# Patient Record
Sex: Female | Born: 1985
Health system: Southern US, Community
[De-identification: ages and names within clinical notes are randomized; demographics above are authoritative.]

## PROBLEM LIST (undated history)

## (undated) ENCOUNTER — Inpatient Hospital Stay (HOSPITAL_COMMUNITY): Payer: Self-pay

## (undated) DIAGNOSIS — T4145XA Adverse effect of unspecified anesthetic, initial encounter: Secondary | ICD-10-CM

## (undated) DIAGNOSIS — J40 Bronchitis, not specified as acute or chronic: Secondary | ICD-10-CM

## (undated) DIAGNOSIS — J302 Other seasonal allergic rhinitis: Secondary | ICD-10-CM

## (undated) DIAGNOSIS — J45909 Unspecified asthma, uncomplicated: Secondary | ICD-10-CM

## (undated) DIAGNOSIS — T8859XA Other complications of anesthesia, initial encounter: Secondary | ICD-10-CM

## (undated) DIAGNOSIS — B182 Chronic viral hepatitis C: Secondary | ICD-10-CM

## (undated) HISTORY — PX: WISDOM TOOTH EXTRACTION: SHX21

## (undated) HISTORY — DX: Chronic viral hepatitis C: B18.2

---
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
## 2005-06-27 ENCOUNTER — Ambulatory Visit: Payer: Self-pay | Admitting: Internal Medicine

## 2006-01-16 ENCOUNTER — Ambulatory Visit: Payer: Self-pay | Admitting: Internal Medicine

## 2009-02-07 ENCOUNTER — Ambulatory Visit: Payer: Self-pay | Admitting: Internal Medicine

## 2009-02-07 DIAGNOSIS — B1710 Acute hepatitis C without hepatic coma: Secondary | ICD-10-CM | POA: Insufficient documentation

## 2009-02-07 LAB — CONVERTED CEMR LAB
ALT: 8 units/L (ref 0–35)
Alkaline Phosphatase: 66 units/L (ref 39–117)
Basophils Absolute: 0.1 10*3/uL (ref 0.0–0.1)
HCT: 41.3 % (ref 36.0–46.0)
HCV Ab: REACTIVE — AB
INR: 1.06 (ref <1.50)
Indirect Bilirubin: 0.2 mg/dL (ref 0.0–0.9)
Lymphocytes Relative: 20 % (ref 12–46)
Lymphs Abs: 1.6 10*3/uL (ref 0.7–4.0)
Monocytes Absolute: 1.7 10*3/uL — ABNORMAL HIGH (ref 0.1–1.0)
Neutro Abs: 4.5 10*3/uL (ref 1.7–7.7)
Neutrophils Relative %: 55 % (ref 43–77)
Prothrombin Time: 13.7 s (ref 11.6–15.2)
Total Bilirubin: 0.3 mg/dL (ref 0.3–1.2)

## 2009-02-10 ENCOUNTER — Telehealth: Payer: Self-pay | Admitting: Internal Medicine

## 2009-06-28 ENCOUNTER — Ambulatory Visit: Payer: Self-pay | Admitting: Internal Medicine

## 2009-10-10 ENCOUNTER — Encounter: Admission: RE | Admit: 2009-10-10 | Discharge: 2009-10-10 | Payer: Self-pay | Admitting: Family Medicine

## 2010-02-21 NOTE — Assessment & Plan Note (Signed)
Summary: TO EST /HEA   Vital Signs:  Patient profile:   25 year old female Height:      61 inches Weight:      122.25 pounds BMI:     23.18 O2 Sat:      98 % Temp:     98.3 degrees F oral Pulse rate:   86 / minute Pulse rhythm:   regular Resp:     18 per minute BP sitting:   90 / 62  (left arm) Cuff size:   regular  Vitals Entered By: Glendell Docker CMA (February 07, 2009 9:42 AM)  Primary Care Provider:  Dondra Spry DO  CC:  New Patient.  History of Present Illness: New Patient   25 y/o white female to establish.  she reports hx of abnl LFT's age 32.  Her prev checked hepatitis serologies and noted hep c antibody.  she was told she may be carrier.  she does not recall whether viral load checked.  no high risk factors.  she underwent liver biopsy but no subsequent follow up.  no hx of jaundice or scleral icterus.   she works as Museum/gallery exhibitions officer.  no hx of blood exposure or needle stick.   no hx of IVDU no high risk sexual contacts   Preventive Screening-Counseling & Management  Alcohol-Tobacco     Alcohol drinks/day: 1 per month     Alcohol type: all     Smoking Status: never  Caffeine-Diet-Exercise     Caffeine use/day: 2 beverages daily     Does Patient Exercise: yes     Times/week: 3  Allergies (verified): No Known Drug Allergies  Past History:  Past Medical History: ? hx of Hepatitis C  Past Surgical History: Caesarean section  Family History: Family History of Arthritis Family History of Endocrine disorder- Bladder Cancer Family History High cholesterol Family History Hypertension Family History of Stroke F 1st degree relative <60  Social History: Occupation: EMT [employer] Single 1 daughter 4   lives with parents father of her child shares custodySmoking Status:  never Caffeine use/day:  2 beverages daily Does Patient Exercise:  yes  Review of Systems  The patient denies fever, weight loss, weight gain, chest pain, syncope, dyspnea on exertion,  abdominal pain, severe indigestion/heartburn, and depression.    Physical Exam  General:  alert, well-developed, and well-nourished.   Head:  normocephalic and atraumatic.   Eyes:  pupils equal, pupils round, and pupils reactive to light.  no scleral icterus Ears:  R ear normal and L ear normal.   Mouth:  Oral mucosa and oropharynx without lesions or exudates.  Teeth in good repair. Neck:  No deformities, masses, or tenderness noted. Lungs:  Normal respiratory effort, chest expands symmetrically. Lungs are clear to auscultation, no crackles or wheezes. Heart:  Normal rate and regular rhythm. S1 and S2 normal without gallop, murmur, click, rub or other extra sounds. Abdomen:  Bowel sounds positive,abdomen soft and non-tender without masses, organomegaly or hernias noted. Extremities:  No lower extremity edema  Neurologic:  cranial nerves II-XII intact and gait normal.   Psych:  normally interactive, good eye contact, not anxious appearing, and not depressed appearing.     Impression & Recommendations:  Problem # 1:  HEPATITIS C (ICD-070.51) 25 y/o with hx of positive Hep C antibody.  confirm diagnosis.  if viral load - refer to hep c clinic.  she has been vaccinated against hep b.  she thinks she has also been vaccinated against hep  A .  she will check her records.  Avoid alcohol and tylenol.  Orders: T-Hepatitis C Antibody ([id number]) T-Hepatitis C Viral Load [phone]) T-Hepatic Function [phone]) T-Protime, Auto ([id number]) T-CBC w/Diff ([id number]) T-HIV Antibody  (Reflex) [phone])  Complete Medication List: 1)  Trinessa (28) 0.18/0.215/0.25 Mg-35 Mcg Tabs (Norgestim-eth estrad triphasic) .... Take 1 tablet by mouth once a day  Patient Instructions: 1)  Please schedule a follow-up appointment in 2 months.   Preventive Care Screening  Last Tetanus Booster:    Date:  08/10/2008    Results:  Historical   Pap Smear:    Date:  08/10/2008    Results:   normal     Immunization History:  Influenza Immunization History:    Influenza:  historical (01/17/2009)   Current Allergies (reviewed today): No known allergies

## 2010-02-21 NOTE — Progress Notes (Signed)
  Phone Note Outgoing Call   Summary of Call: LVM for pt to call back re:  blood test results Initial call taken by: D. Thomos Lemons DO,  February 10, 2009 5:34 PM  Follow-up for Phone Call        discussed results with pt.   pt likely had falso positive hep c antibody Follow-up by: D. Thomos Lemons DO,  February 11, 2009 4:54 PM

## 2010-07-31 ENCOUNTER — Emergency Department (HOSPITAL_COMMUNITY): Payer: 59

## 2010-07-31 ENCOUNTER — Inpatient Hospital Stay (INDEPENDENT_AMBULATORY_CARE_PROVIDER_SITE_OTHER)
Admission: RE | Admit: 2010-07-31 | Discharge: 2010-07-31 | Disposition: A | Discharge status: Home / Self Care | Payer: 59 | Source: Ambulatory Visit | Attending: Emergency Medicine | Admitting: Emergency Medicine

## 2010-07-31 ENCOUNTER — Emergency Department (HOSPITAL_COMMUNITY)
Admission: EM | Admit: 2010-07-31 | Discharge: 2010-07-31 | Disposition: A | Discharge status: Home / Self Care | Payer: 59 | Attending: Emergency Medicine | Admitting: Emergency Medicine

## 2010-07-31 DIAGNOSIS — R079 Chest pain, unspecified: Secondary | ICD-10-CM | POA: Insufficient documentation

## 2010-07-31 DIAGNOSIS — Z8249 Family history of ischemic heart disease and other diseases of the circulatory system: Secondary | ICD-10-CM | POA: Insufficient documentation

## 2010-07-31 LAB — POCT URINALYSIS DIP (DEVICE)
Ketones, ur: NEGATIVE mg/dL
Leukocytes, UA: NEGATIVE
Nitrite: NEGATIVE
Specific Gravity, Urine: 1.015 (ref 1.005–1.030)
Urobilinogen, UA: 1 mg/dL (ref 0.0–1.0)

## 2010-08-04 ENCOUNTER — Telehealth: Payer: Self-pay | Admitting: *Deleted

## 2010-08-04 NOTE — Telephone Encounter (Signed)
VM left by pt stating that she had been seen in the ED for chest pains.  Eval in ED was ruled inconclusive.  Pt states she was told by attending physician that she would need a referral to cardiology for stress test.  Pt would like referral to cardiology.  Pt has not been seen by Dr. Artist Pais since 01/2009.  RC to pt.  I have attempted to contact this patient by phone with the following results: left message to return my call on answering machine (home).

## 2010-08-16 NOTE — Telephone Encounter (Signed)
Pt initiated call on 08/03/10.  RC to pt has not been returned.  Follow up ended.

## 2010-12-08 ENCOUNTER — Encounter: Payer: Self-pay | Admitting: Family

## 2010-12-08 ENCOUNTER — Telehealth: Payer: Self-pay | Admitting: Family

## 2010-12-08 ENCOUNTER — Ambulatory Visit (HOSPITAL_BASED_OUTPATIENT_CLINIC_OR_DEPARTMENT_OTHER)
Admission: RE | Admit: 2010-12-08 | Discharge: 2010-12-08 | Disposition: A | Discharge status: Home / Self Care | Payer: 59 | Source: Ambulatory Visit | Attending: Family | Admitting: Family

## 2010-12-08 ENCOUNTER — Ambulatory Visit (INDEPENDENT_AMBULATORY_CARE_PROVIDER_SITE_OTHER): Payer: 59 | Admitting: Family

## 2010-12-08 VITALS — BP 96/70 | HR 78 | Temp 97.8°F | Resp 16 | Wt 124.0 lb

## 2010-12-08 DIAGNOSIS — R220 Localized swelling, mass and lump, head: Secondary | ICD-10-CM

## 2010-12-08 DIAGNOSIS — L0201 Cutaneous abscess of face: Secondary | ICD-10-CM

## 2010-12-08 DIAGNOSIS — L03211 Cellulitis of face: Secondary | ICD-10-CM | POA: Insufficient documentation

## 2010-12-08 DIAGNOSIS — B1710 Acute hepatitis C without hepatic coma: Secondary | ICD-10-CM

## 2010-12-08 DIAGNOSIS — B1920 Unspecified viral hepatitis C without hepatic coma: Secondary | ICD-10-CM

## 2010-12-08 DIAGNOSIS — Z0189 Encounter for other specified special examinations: Secondary | ICD-10-CM

## 2010-12-08 DIAGNOSIS — R221 Localized swelling, mass and lump, neck: Secondary | ICD-10-CM

## 2010-12-08 LAB — HEPATIC FUNCTION PANEL
ALT: 9 U/L (ref 0–35)
AST: 14 U/L (ref 0–37)
Bilirubin, Direct: 0.1 mg/dL (ref 0.0–0.3)
Indirect Bilirubin: 0.3 mg/dL (ref 0.0–0.9)

## 2010-12-08 MED ORDER — IOHEXOL 300 MG/ML  SOLN
75.0000 mL | Freq: Once | INTRAMUSCULAR | Status: AC | PRN
  Administered 2010-12-08: 75 mL via INTRAVENOUS

## 2010-12-08 MED ORDER — DOXYCYCLINE HYCLATE 100 MG PO CAPS: 100.0000 mg | ORAL_CAPSULE | Freq: Two times a day (BID) | ORAL | Status: DC

## 2010-12-08 NOTE — Assessment & Plan Note (Signed)
Last visit with Dr. Artist Pais >1 yr ago she had an undetectable viral load and normal LFT's.  Will repeat today.

## 2010-12-08 NOTE — Assessment & Plan Note (Signed)
25 yr old female with mild left sided facial swelling and tenderness.  CT is neg for abscess, though does not some mild LAD on the left neck- likely reactive.  Will plan to treat with doxy for probable early cellulitis.  She added claritin which is reasonable to continue for now. She is instructed to call if increased pain, swelling redness. Go to ER immediately if visual problems.  She verbalizes understanding.

## 2010-12-08 NOTE — Telephone Encounter (Signed)
Left message for pt to return our call.  When she calls please let her know that her CT did not show any abscess.  Noted mild enlargement of lymph nodes on her left which I would expect.  She should plan to take abx as we discussed and follow up early next week.

## 2010-12-08 NOTE — Telephone Encounter (Signed)
Pt returned my call. Reviewed results and plans as outlined below.

## 2010-12-08 NOTE — Patient Instructions (Addendum)
Please complete your lab work prior to leaving today. Complete your CT on the first floor.  Follow up with Dr. Rodena Medin on Monday or Tuesday of next week.   Call if you develop increased pain, swelling redness. Go to the ER if you develop problems with vision.

## 2010-12-08 NOTE — Progress Notes (Signed)
  Subjective:    Patient ID: Wendy Nunez, female    DOB: 09/21/1985, 24 y.o.   MRN: [id number]  HPI  Ms.  Wendy Nunez is a 25 yr old female who presents today with chief complaint of left sided facial swelling. She reports that yesterday she had significant swelling of her left eyelid and was unable to apply mascara.  "I couldn't find my eyelashes."  Symptoms started 48 hours ago and are unchanged despite the use of benadryl or claritin. Swelling is associated with tenderness and tingling but not associated with fever or pruritis.   She denies previous history of similar symptoms.    Hepatitis C-  She believes that she contracted hepatitis C when she was a senior in high school and she completed a medical mission trip to [place].  It was shortly after her trip that she was noted to elevated LFT's.  She denies any other risk factors for hepatitis C. 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    Review of Systems See HPI  Past Medical History  Diagnosis Date  . 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 Marital Status: Single    Spouse Name: N/A    Number of Children: N/A  . Years of Education: N/A   Occupational History  . Not on file.   Social History Main Topics  . Smoking status: Never Smoker   . Smokeless tobacco: Never Used  . Alcohol Use: Not on file  . Drug Use: Not on file  . Sexually Active: Not on file   Other Topics Concern  . 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    Past Surgical History  Procedure Date  . Cesarean section     Family History  Problem Relation Age of Onset  .       No Known Allergies  No current outpatient prescriptions on file prior to visit.   No current facility-administered medications on file prior to visit.    BP 96/70  Pulse 78  Temp(Src) 97.8 F (36.6 C) (Oral)  Resp 16  Wt 124 lb 0.6 oz (56.264 kg)  LMP  12/05/2010         Objective:   Physical Exam  Constitutional: She appears well-developed and well-nourished. No distress.  HENT:  Head: Normocephalic and atraumatic.  Right Ear: Tympanic membrane and ear canal normal.  Left Ear: Tympanic membrane and ear canal normal.  Mouth/Throat: Uvula is midline, oropharynx is clear and moist and mucous membranes are normal.       + mild swelling of left cheek with tenderness to palpation.  No significant erythema is noted.   Neck:       + tenderness left anterior neck without significant LAD swelling or erythema.   Cardiovascular: Normal rate and regular rhythm.   No murmur heard. Pulmonary/Chest: Effort normal and breath sounds normal. No respiratory distress. She has no wheezes. She has no rales.          Assessment & Plan:

## 2010-12-11 ENCOUNTER — Telehealth: Payer: Self-pay | Admitting: *Deleted

## 2010-12-11 MED ORDER — AMOXICILLIN-POT CLAVULANATE 875-125 MG PO TABS: 1.0000 | ORAL_TABLET | Freq: Two times a day (BID) | ORAL | Status: AC

## 2010-12-11 NOTE — Telephone Encounter (Signed)
Received call from pt stating she is taking doxycycline exactly as recommended but she has had n/v all weekend and wants to know if there is another antibiotic she can try? Please advise.

## 2010-12-11 NOTE — Telephone Encounter (Signed)
augmentin 875mg  po bid x 6d. May cause loose stools

## 2010-12-11 NOTE — Telephone Encounter (Signed)
Pt notified and rx sent to [facility] pharmacy.

## 2010-12-12 ENCOUNTER — Ambulatory Visit (INDEPENDENT_AMBULATORY_CARE_PROVIDER_SITE_OTHER): Payer: 59 | Admitting: Internal Medicine

## 2010-12-12 ENCOUNTER — Encounter: Payer: Self-pay | Admitting: Internal Medicine

## 2010-12-12 DIAGNOSIS — L03211 Cellulitis of face: Secondary | ICD-10-CM

## 2010-12-12 DIAGNOSIS — R894 Abnormal immunological findings in specimens from other organs, systems and tissues: Secondary | ICD-10-CM

## 2010-12-12 DIAGNOSIS — R768 Other specified abnormal immunological findings in serum: Secondary | ICD-10-CM

## 2010-12-12 LAB — HEPATITIS C RNA QUANTITATIVE

## 2010-12-12 NOTE — Progress Notes (Signed)
  Subjective:    Patient ID: Wendy Nunez, female    DOB: 1985-09-02, 25 y.o.   MRN: [id number]  HPI Pt presents to clinic for followup of multiple medical problems. Recently evaluated for left periorbital cellulitis. CT neg. Initially placed on doxycycline but could not tolerate. Yesterday changed to augmentin. Notes significant improvement of swelling. Has intact EOM and no visual changes. No f/c. Has questions about hep c status. Recalls several years ago had mildly elevated lft's. As part of the workup underwent hepatitis testing and was told had hep c ab +.  Reviewed past labs including normalized lft's and a undetectable hep c RNA quant. Has repeat quant pending from last visit. No known potential hep c exposures. No s/s of liver dz.  Reviewed pmh, medications, and allergies  Review of Systems see hpi     Objective:   Physical Exam  Nursing note and vitals reviewed. Constitutional: She appears well-developed and well-nourished. No distress.  HENT:  Head: Normocephalic and atraumatic.  Right Ear: External ear normal.  Left Ear: External ear normal.  Eyes: Conjunctivae and EOM are normal. Right eye exhibits no discharge. Left eye exhibits no discharge. No scleral icterus.  Skin: Skin is warm and dry. She is not diaphoretic.       No appreciable ST swelling of eyelid or periorbital area.          Assessment & Plan:

## 2010-12-16 DIAGNOSIS — R768 Other specified abnormal immunological findings in serum: Secondary | ICD-10-CM | POA: Insufficient documentation

## 2010-12-16 NOTE — Assessment & Plan Note (Signed)
Significant improvement. Continue abx to completion.

## 2010-12-16 NOTE — Assessment & Plan Note (Signed)
In the setting of very low risk person, nl lft's and neg rna quant suspect ab is false positive. Repeat quant pending currently.

## 2010-12-18 ENCOUNTER — Encounter: Payer: Self-pay | Admitting: Family

## 2011-01-04 ENCOUNTER — Encounter: Payer: Self-pay | Admitting: Family

## 2011-01-04 ENCOUNTER — Encounter: Payer: Self-pay | Admitting: *Deleted

## 2011-01-04 ENCOUNTER — Ambulatory Visit: Payer: 59 | Admitting: Internal Medicine

## 2011-01-05 ENCOUNTER — Ambulatory Visit (INDEPENDENT_AMBULATORY_CARE_PROVIDER_SITE_OTHER): Payer: 59 | Admitting: Internal Medicine

## 2011-01-05 ENCOUNTER — Ambulatory Visit (HOSPITAL_BASED_OUTPATIENT_CLINIC_OR_DEPARTMENT_OTHER)
Admission: RE | Admit: 2011-01-05 | Discharge: 2011-01-05 | Disposition: A | Discharge status: Home / Self Care | Payer: 59 | Source: Ambulatory Visit | Attending: Internal Medicine | Admitting: Internal Medicine

## 2011-01-05 ENCOUNTER — Encounter: Payer: Self-pay | Admitting: Internal Medicine

## 2011-01-05 VITALS — BP 100/70 | HR 66 | Temp 98.2°F | Resp 16 | Wt 142.0 lb

## 2011-01-05 DIAGNOSIS — M25579 Pain in unspecified ankle and joints of unspecified foot: Secondary | ICD-10-CM | POA: Insufficient documentation

## 2011-01-05 DIAGNOSIS — R609 Edema, unspecified: Secondary | ICD-10-CM | POA: Insufficient documentation

## 2011-01-05 DIAGNOSIS — M25572 Pain in left ankle and joints of left foot: Secondary | ICD-10-CM

## 2011-01-05 DIAGNOSIS — W19XXXA Unspecified fall, initial encounter: Secondary | ICD-10-CM

## 2011-01-05 DIAGNOSIS — M7989 Other specified soft tissue disorders: Secondary | ICD-10-CM

## 2011-01-05 MED ORDER — DICLOFENAC SODIUM 50 MG PO TBEC: DELAYED_RELEASE_TABLET | ORAL | Status: AC

## 2011-01-05 NOTE — Progress Notes (Signed)
  Subjective:    Patient ID: Wendy Nunez, female    DOB: 1985-04-16, 25 y.o.   MRN: [id number]  HPI Pt presents to clinic for evaluation of ankle pain. Notes ~1 wk h/o left ankle injury s/p fall. Unsure of exact injury mechanism but may have been eversion. Pain primarily near lateral malleolar area. Able to wt bear but since injury has noted difficulty with instability and secondary falls. attemped RICE measures. No other alleviating or exacerbating factors. No other complaints.  Past Medical History  Diagnosis Date  . Hepatitis C carrier    Past Surgical History  Procedure Date  . Cesarean section     reports that she has never smoked. She has never used smokeless tobacco. Her alcohol and drug histories not on file. family history includes Arthritis in her other; Cancer in her other; Hyperlipidemia in her other; Hypertension in her other; and Stroke in her other. No Known Allergies    Review of Systems see hpi     Objective:   Physical Exam  Nursing note and vitals reviewed. Constitutional: She appears well-developed and well-nourished. No distress.  Musculoskeletal:       Left ankle/foot with mild ST swelling. Distal fibula/lat malleolar area with point tenderness without bony abn. Pain reproduced with ankle eversion. Able to wt bear and ambulate without assistance.  Neurological: She is alert.  Skin: Skin is warm and dry. She is not diaphoretic.  Psychiatric: She has a normal mood and affect.          Assessment & Plan:

## 2011-01-06 DIAGNOSIS — M25572 Pain in left ankle and joints of left foot: Secondary | ICD-10-CM | POA: Insufficient documentation

## 2011-01-06 NOTE — Assessment & Plan Note (Signed)
Obtain plain radiograph of left ankle. Attempt voltaren with food and no other nsaid. Consider referral if xray unremarkable and sx's persistent given subjective instability

## 2011-01-08 ENCOUNTER — Ambulatory Visit: Payer: 59 | Admitting: Family Medicine

## 2011-01-29 ENCOUNTER — Ambulatory Visit: Payer: Self-pay

## 2011-01-29 ENCOUNTER — Other Ambulatory Visit: Payer: Self-pay | Admitting: Occupational Medicine

## 2011-01-29 DIAGNOSIS — R52 Pain, unspecified: Secondary | ICD-10-CM

## 2011-01-30 ENCOUNTER — Other Ambulatory Visit: Payer: Self-pay | Admitting: Occupational Medicine

## 2011-01-30 DIAGNOSIS — M25561 Pain in right knee: Secondary | ICD-10-CM

## 2011-01-30 DIAGNOSIS — T148XXA Other injury of unspecified body region, initial encounter: Secondary | ICD-10-CM

## 2011-02-02 ENCOUNTER — Ambulatory Visit (HOSPITAL_COMMUNITY)
Admission: RE | Admit: 2011-02-02 | Discharge: 2011-02-02 | Disposition: A | Discharge status: Home / Self Care | Payer: PRIVATE HEALTH INSURANCE | Source: Ambulatory Visit | Attending: Occupational Medicine | Admitting: Occupational Medicine

## 2011-02-02 DIAGNOSIS — M25561 Pain in right knee: Secondary | ICD-10-CM

## 2011-02-02 DIAGNOSIS — X500XXA Overexertion from strenuous movement or load, initial encounter: Secondary | ICD-10-CM | POA: Insufficient documentation

## 2011-02-02 DIAGNOSIS — M25669 Stiffness of unspecified knee, not elsewhere classified: Secondary | ICD-10-CM | POA: Insufficient documentation

## 2011-02-02 DIAGNOSIS — T148XXA Other injury of unspecified body region, initial encounter: Secondary | ICD-10-CM

## 2011-02-02 DIAGNOSIS — M25569 Pain in unspecified knee: Secondary | ICD-10-CM | POA: Insufficient documentation

## 2011-02-02 DIAGNOSIS — M25469 Effusion, unspecified knee: Secondary | ICD-10-CM | POA: Insufficient documentation

## 2011-02-02 DIAGNOSIS — S8990XA Unspecified injury of unspecified lower leg, initial encounter: Secondary | ICD-10-CM | POA: Insufficient documentation

## 2011-04-23 ENCOUNTER — Encounter: Payer: Self-pay | Admitting: Family

## 2011-04-23 ENCOUNTER — Ambulatory Visit (INDEPENDENT_AMBULATORY_CARE_PROVIDER_SITE_OTHER): Payer: Self-pay | Admitting: Family

## 2011-04-23 VITALS — BP 100/68 | HR 73 | Temp 98.8°F | Resp 16 | Wt 145.1 lb

## 2011-04-23 DIAGNOSIS — F438 Other reactions to severe stress: Secondary | ICD-10-CM

## 2011-04-23 DIAGNOSIS — F430 Acute stress reaction: Secondary | ICD-10-CM | POA: Insufficient documentation

## 2011-04-23 DIAGNOSIS — F4389 Other reactions to severe stress: Secondary | ICD-10-CM

## 2011-04-23 NOTE — Patient Instructions (Signed)
You will be contacted about your referral to behavioral health.

## 2011-04-23 NOTE — Assessment & Plan Note (Signed)
I do not think that she needs medication at this point as her reaction to her situation is very normal.  We discussed that referral to a therapist may be helpful for her and she wishes to proceed with this referral.

## 2011-04-23 NOTE — Progress Notes (Signed)
  Subjective:    Patient ID: Wendy Nunez, female    DOB: [birth year], 26 y.o.   MRN: [id number]  HPI  Wendy Nunez is a 26 yr old female who presents today to discuss stress.  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  She reports that she notes some occasional anxiety about her situation, but denies hopelessness.  She reports that she is sleeping well.    Review of Systems See HPI    Objective:   Physical Exam  Constitutional: She appears well-developed and well-nourished. No distress.  Psychiatric: Her speech is normal and behavior is normal. Judgment and thought content normal. Her mood appears not anxious. Her affect is not angry, not blunt, not labile and not inappropriate. Cognition and memory are normal.       Mildly tearful but appropriate affect.           Assessment & Plan:

## 2012-02-01 ENCOUNTER — Ambulatory Visit (INDEPENDENT_AMBULATORY_CARE_PROVIDER_SITE_OTHER): Payer: 59 | Admitting: Family

## 2012-02-01 ENCOUNTER — Encounter: Payer: Self-pay | Admitting: Family

## 2012-02-01 ENCOUNTER — Ambulatory Visit (HOSPITAL_BASED_OUTPATIENT_CLINIC_OR_DEPARTMENT_OTHER)
Admission: RE | Admit: 2012-02-01 | Discharge: 2012-02-01 | Disposition: A | Discharge status: Home / Self Care | Payer: 59 | Source: Ambulatory Visit | Attending: Family | Admitting: Family

## 2012-02-01 VITALS — BP 114/72 | HR 74 | Temp 97.8°F | Resp 16

## 2012-02-01 DIAGNOSIS — G5600 Carpal tunnel syndrome, unspecified upper limb: Secondary | ICD-10-CM

## 2012-02-01 DIAGNOSIS — M542 Cervicalgia: Secondary | ICD-10-CM | POA: Insufficient documentation

## 2012-02-01 DIAGNOSIS — R768 Other specified abnormal immunological findings in serum: Secondary | ICD-10-CM

## 2012-02-01 DIAGNOSIS — M4040 Postural lordosis, site unspecified: Secondary | ICD-10-CM | POA: Insufficient documentation

## 2012-02-01 DIAGNOSIS — G5602 Carpal tunnel syndrome, left upper limb: Secondary | ICD-10-CM | POA: Insufficient documentation

## 2012-02-01 DIAGNOSIS — R894 Abnormal immunological findings in specimens from other organs, systems and tissues: Secondary | ICD-10-CM

## 2012-02-01 MED ORDER — CYCLOBENZAPRINE HCL 5 MG PO TABS: 5.0000 mg | ORAL_TABLET | Freq: Three times a day (TID) | ORAL | Status: DC | PRN

## 2012-02-01 MED ORDER — MELOXICAM 7.5 MG PO TABS: 7.5000 mg | ORAL_TABLET | Freq: Every day | ORAL | Status: DC

## 2012-02-01 NOTE — Progress Notes (Signed)
  Subjective:    Patient ID: Wendy Nunez, female    DOB: Apr 02, 1985, 27 y.o.   MRN: [id number]  HPI  Wendy Nunez is a 27 yr old female who presents today with chief complaint of headache. She reports that she has had daily headaches for 2 months.  She also notes left upper back pain/shoulder pain.  Pain is sharp and stabbing pain in the left neck area.  Occasional numbness in the left middle 3 fingers.  She tried decreasing her caffeine, ibuprofen and excedrin migraine without improvement. Denies associated photophobia or nausea.  Sometimes left hand feels week.      Review of Systems See HPI  Past Medical History  Diagnosis Date  . 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 Marital Status: Single    Spouse Name: N/A    Number of Children: N/A  . Years of Education: N/A   Occupational History  . Not on file.   Social History Main Topics  . Smoking status: Never Smoker   . Smokeless tobacco: Never Used  . Alcohol Use: Not on file  . Drug Use: Not on file  . Sexually Active: Not on file   Other Topics Concern  . 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    Past Surgical History  Procedure Date  . Cesarean section     Family History  Problem Relation Age of Onset  . Arthritis Other   . Cancer Other     bladder  . Hyperlipidemia Other   . Hypertension Other   . Stroke Other     No Known Allergies  No current outpatient prescriptions on file prior to visit.    BP 114/72  Pulse 74  Temp 97.8 F (36.6 C) (Oral)  Resp 16  SpO2 99%  LMP 01/25/2012       Objective:   Physical Exam  Constitutional: She is oriented to person, place, and time. She appears well-developed and well-nourished. No distress.  HENT:  Head: Normocephalic and atraumatic.  Cardiovascular: Normal rate and regular rhythm.   No murmur heard. Pulmonary/Chest: Effort normal and breath sounds  normal. No respiratory distress. She has no wheezes. She has no rales. She exhibits no tenderness.  Musculoskeletal: She exhibits no edema.       + tenderness at left base of neck.   Neurological: She is alert and oriented to person, place, and time.       Bilateral hand grasps 5/5 + phalans, + tinels sign left.   Skin: Skin is warm and dry.  Psychiatric: She has a normal mood and affect. Her behavior is normal. Judgment and thought content normal.          Assessment & Plan:

## 2012-02-01 NOTE — Assessment & Plan Note (Signed)
Recommended trial of cock up wrist splint and nsaids.

## 2012-02-01 NOTE — Assessment & Plan Note (Signed)
Repeat viral load last visit was undetectable- likely was false +.

## 2012-02-01 NOTE — Patient Instructions (Addendum)
Please complete your neck x ray on the first floor.  Wear wrist splint left wrist while sleeping and as tolerated during the day. Call if symptoms worsen or if not improved in 1 month.

## 2012-02-01 NOTE — Assessment & Plan Note (Signed)
Trial of meloxicam and prn flexeril for short term use.  Obtain x-ray of the C spine for further evaluation.

## 2012-03-12 ENCOUNTER — Ambulatory Visit (INDEPENDENT_AMBULATORY_CARE_PROVIDER_SITE_OTHER): Payer: 59 | Admitting: Family

## 2012-03-12 ENCOUNTER — Encounter: Payer: Self-pay | Admitting: Family

## 2012-03-12 VITALS — BP 100/70 | HR 71 | Temp 98.1°F | Resp 16 | Wt 150.0 lb

## 2012-03-12 DIAGNOSIS — M542 Cervicalgia: Secondary | ICD-10-CM

## 2012-03-12 NOTE — Progress Notes (Signed)
  Subjective:    Patient ID: Wendy Nunez, female    DOB: December 22, 1985, 27 y.o.   MRN: [id number]  HPI  Wendy Nunez is a 27 yr old female who presents today for follow up of her neck pain. She was initially evaluated for this on 1/10 and underwent x ray of the C spine.  X ray was unremarkable except for note of straightening of the normal cervical lordosis which was though possibly secondary to muscle spasm.  She was rx'd meloxicam and flexeril.  No significant improvement with these meds.  Flexeril made  Her sleep.  She reports that the discomfort is "achey" in the neck.  Discomfort into the bilateral upper shoulders.  She has associated HA.      Review of Systems    see HPI  Past Medical History  Diagnosis Date  . 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 Marital Status: Single    Spouse Name: N/A    Number of Children: N/A  . Years of Education: N/A   Occupational History  . Not on file.   Social History Main Topics  . Smoking status: Never Smoker   . Smokeless tobacco: Never Used  . Alcohol Use: Not on file  . Drug Use: Not on file  . Sexually Active: Not on file   Other Topics Concern  . 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    Past Surgical History  Procedure Laterality Date  . Cesarean section      Family History  Problem Relation Age of Onset  . Arthritis Other   . Cancer Other     bladder  . Hyperlipidemia Other   . Hypertension Other   . Stroke Other     No Known Allergies  Current Outpatient Prescriptions on File Prior to Visit  Medication Sig Dispense Refill  . cyclobenzaprine (FLEXERIL) 5 MG tablet Take 1 tablet (5 mg total) by mouth 3 (three) times daily as needed for muscle spasms.  15 tablet  0  . meloxicam (MOBIC) 7.5 MG tablet Take 1 tablet (7.5 mg total) by mouth daily.  15 tablet  0   No current facility-administered medications on file  prior to visit.    BP 100/70  Pulse 71  Temp(Src) 98.1 F (36.7 C) (Oral)  Resp 16  Wt 150 lb (68.04 kg)  BMI 28.36 kg/m2  SpO2 99%  LMP 02/22/2012    Objective:   Physical Exam  Constitutional: She appears well-developed and well-nourished. No distress.  Cardiovascular: Normal rate and regular rhythm.   No murmur heard. Pulmonary/Chest: Effort normal and breath sounds normal. No respiratory distress. She has no wheezes. She has no rales. She exhibits no tenderness.  Musculoskeletal: She exhibits no edema.  Full ROM of neck.     Neurological:  Bilateral hand grasps/ UE/LE strength is 5/5.          Assessment & Plan:

## 2012-03-12 NOTE — Assessment & Plan Note (Signed)
Will refer for trial of PT.  If symptoms worsen, or if no improvement with PT, consider MRI C spine.

## 2012-03-12 NOTE — Patient Instructions (Addendum)
You will be contacted about your referral to PT.  Please let us know if you have not heard back within 1 week about your referral. Please follow up in 6 weeks.

## 2012-03-21 ENCOUNTER — Ambulatory Visit: Payer: 59 | Attending: Family | Admitting: Rehabilitation

## 2012-03-21 DIAGNOSIS — IMO0001 Reserved for inherently not codable concepts without codable children: Secondary | ICD-10-CM | POA: Insufficient documentation

## 2012-03-21 DIAGNOSIS — M2569 Stiffness of other specified joint, not elsewhere classified: Secondary | ICD-10-CM | POA: Insufficient documentation

## 2012-03-21 DIAGNOSIS — M542 Cervicalgia: Secondary | ICD-10-CM | POA: Insufficient documentation

## 2012-03-23 IMAGING — CR DG ANKLE COMPLETE 3+V*L*
3 series · 3 of 3 positions shown · non-contrast
Comparison: None.

CLINICAL DATA: Ankle pain and swelling status post fall.

LEFT ANKLE COMPLETE - 3+ VIEW

[t ankle joint ap left]
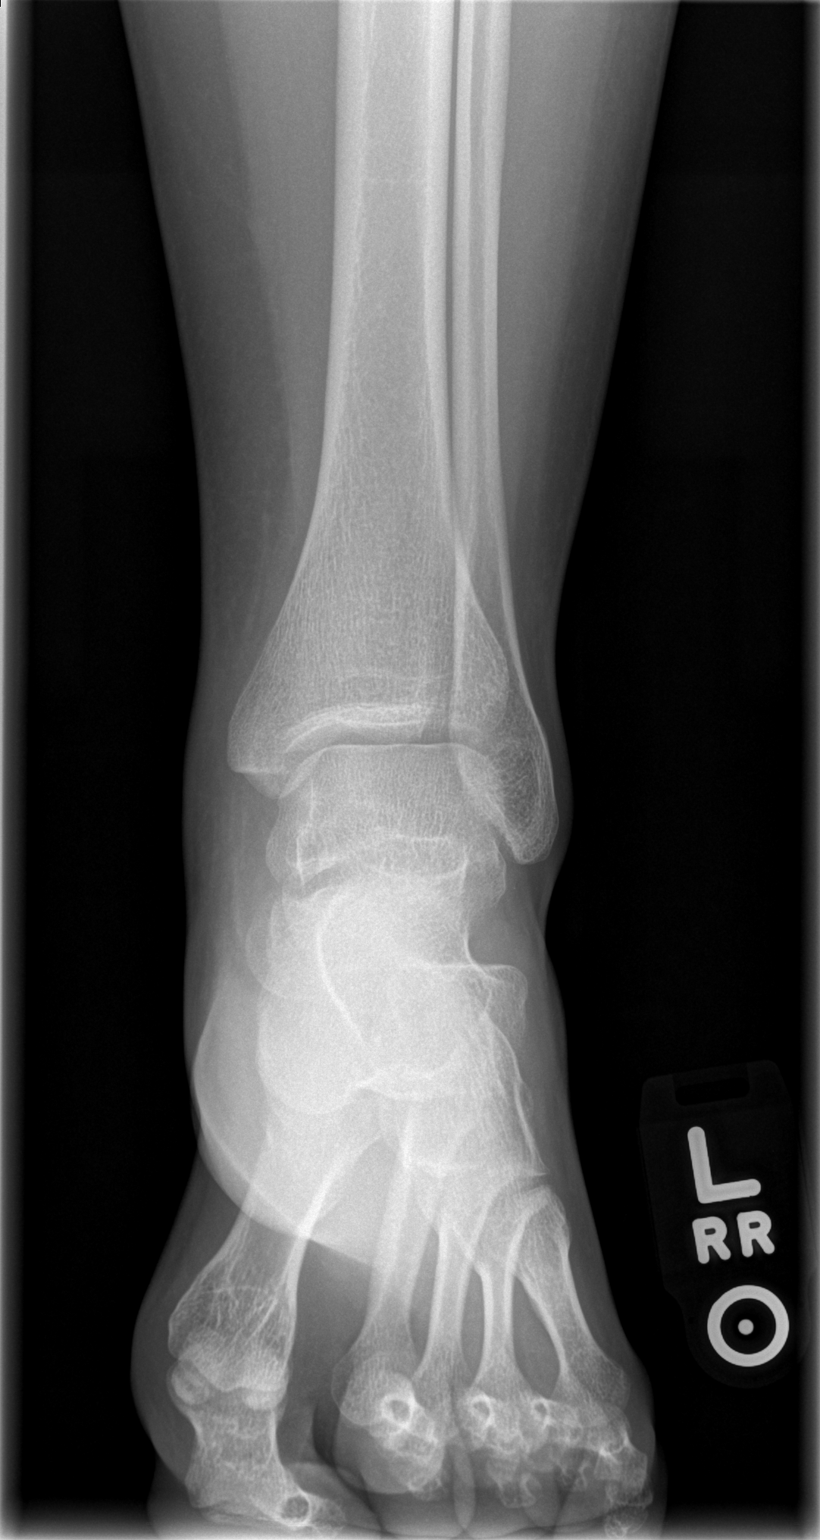

[t ankle joint oblique left]
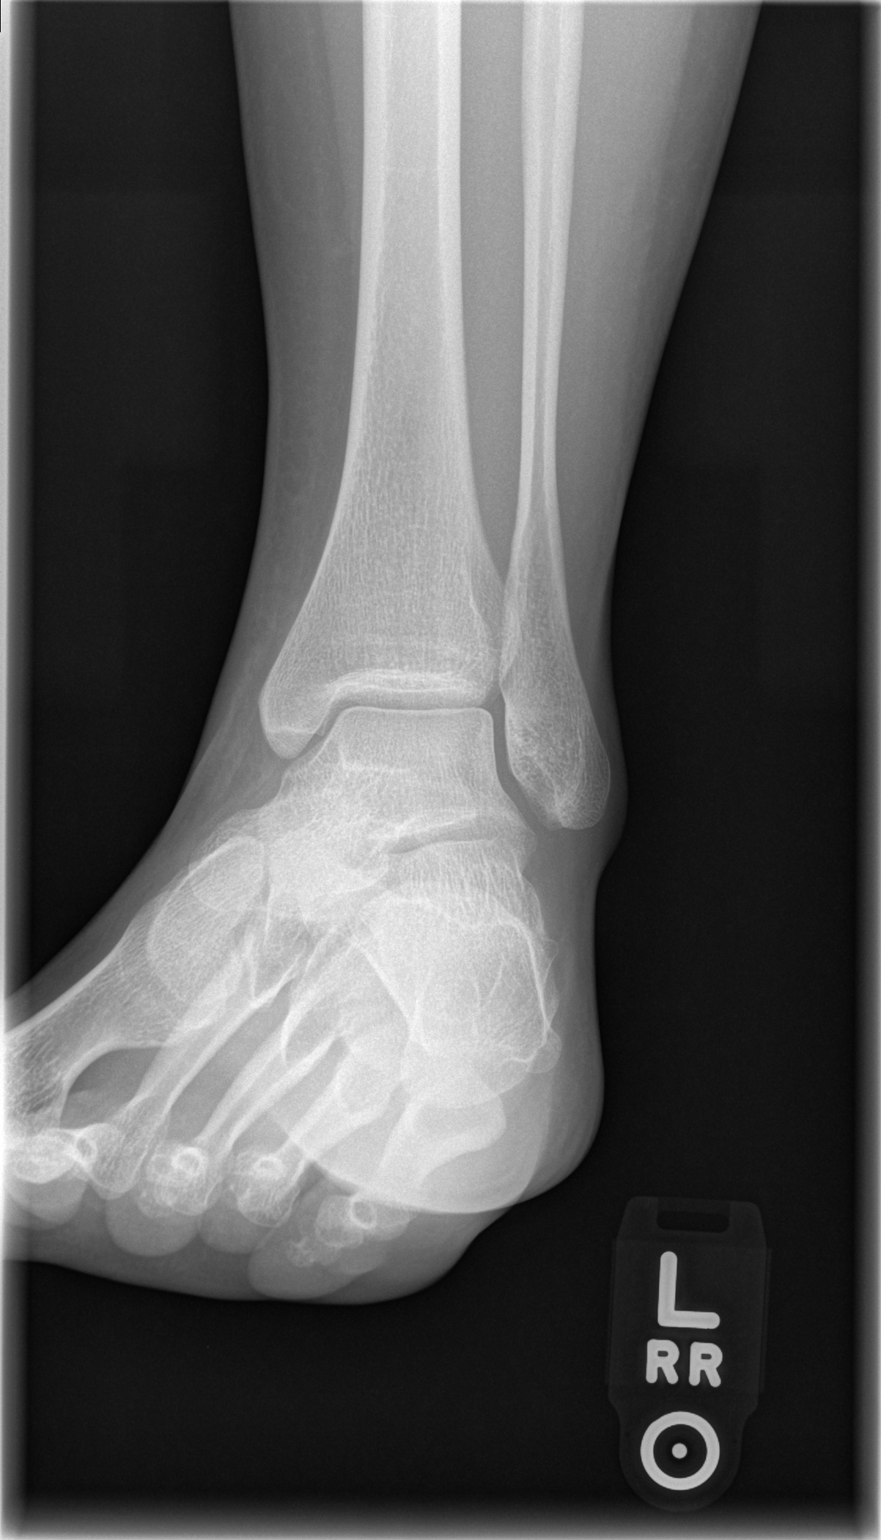

[t ankle joint lat left]
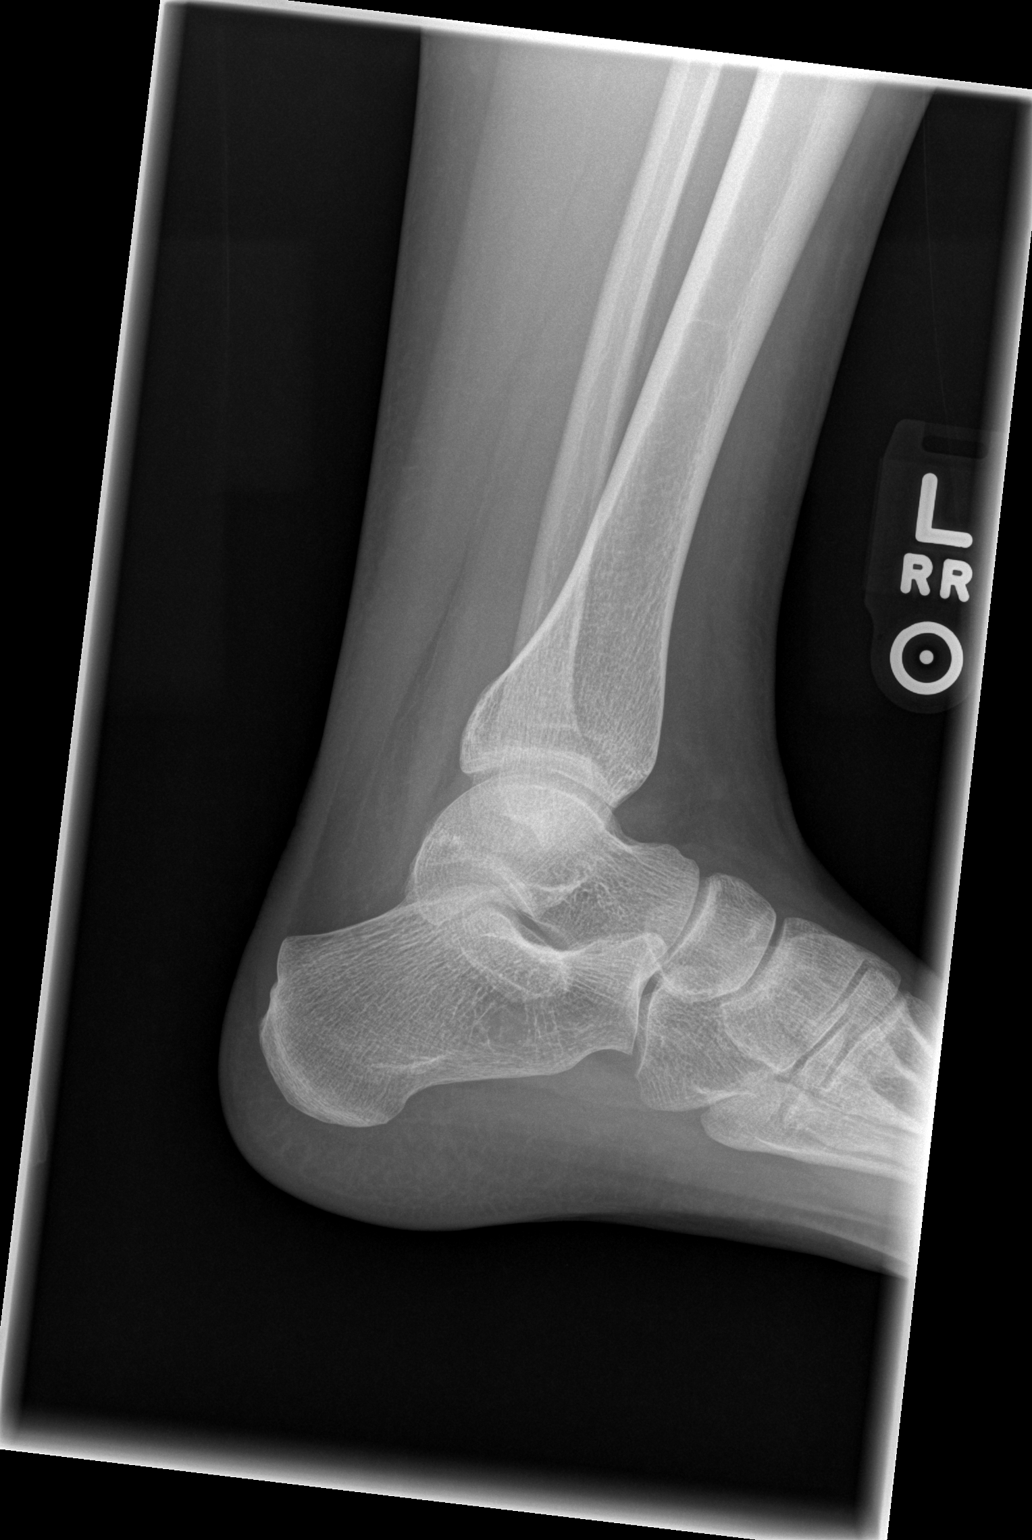

[3 of 3 positions shown; findings below may reference images not displayed]

FINDINGS: The mineralization and alignment are normal.  There is no
evidence of acute fracture or dislocation.  The joint spaces are
preserved.  No focal soft tissue abnormalities are identified.
IMPRESSION: No acute osseous findings.

## 2012-03-28 ENCOUNTER — Encounter: Payer: 59 | Admitting: Rehabilitation

## 2012-04-04 ENCOUNTER — Ambulatory Visit: Payer: 59 | Admitting: Rehabilitation

## 2012-04-11 ENCOUNTER — Encounter: Payer: 59 | Admitting: Rehabilitation

## 2012-04-18 ENCOUNTER — Ambulatory Visit: Payer: 59 | Attending: Family | Admitting: Rehabilitation

## 2012-04-18 DIAGNOSIS — IMO0001 Reserved for inherently not codable concepts without codable children: Secondary | ICD-10-CM | POA: Insufficient documentation

## 2012-04-18 DIAGNOSIS — M542 Cervicalgia: Secondary | ICD-10-CM | POA: Insufficient documentation

## 2012-04-18 DIAGNOSIS — M2569 Stiffness of other specified joint, not elsewhere classified: Secondary | ICD-10-CM | POA: Insufficient documentation

## 2012-04-25 ENCOUNTER — Ambulatory Visit: Payer: 59 | Admitting: Family

## 2012-04-25 DIAGNOSIS — Z0289 Encounter for other administrative examinations: Secondary | ICD-10-CM

## 2012-08-12 ENCOUNTER — Ambulatory Visit: Payer: 59 | Admitting: Internal Medicine

## 2012-11-10 ENCOUNTER — Encounter: Payer: Self-pay | Admitting: Family

## 2012-11-10 ENCOUNTER — Ambulatory Visit (INDEPENDENT_AMBULATORY_CARE_PROVIDER_SITE_OTHER): Payer: 59 | Admitting: Family

## 2012-11-10 VITALS — BP 122/82 | HR 69 | Temp 97.9°F | Resp 16 | Ht 60.0 in | Wt 151.1 lb

## 2012-11-10 DIAGNOSIS — R109 Unspecified abdominal pain: Secondary | ICD-10-CM

## 2012-11-10 DIAGNOSIS — R1012 Left upper quadrant pain: Secondary | ICD-10-CM | POA: Insufficient documentation

## 2012-11-10 LAB — HEPATIC FUNCTION PANEL
ALT: 10 U/L (ref 0–35)
AST: 13 U/L (ref 0–37)
Bilirubin, Direct: 0.1 mg/dL (ref 0.0–0.3)
Indirect Bilirubin: 0.3 mg/dL (ref 0.0–0.9)

## 2012-11-10 MED ORDER — NAPROXEN 500 MG PO TABS: 500.0000 mg | ORAL_TABLET | Freq: Two times a day (BID) | ORAL | Status: DC

## 2012-11-10 NOTE — Patient Instructions (Signed)
Drink plenty of water and eat lots of fresh fruits/veggies. Complete lab work prior to leaving. Start naproxen for pain. Call if symptoms worsen or if not improved in 1 week.

## 2012-11-10 NOTE — Progress Notes (Signed)
  Subjective:    Patient ID: Wendy Nunez, female    DOB: Aug 19, 1985, 27 y.o.   MRN: [id number]  HPI Ms. Wendy Nunez is a 27 year old female who presents today with a chief complaint of abdominal pain. Patient reports pain to be located to the LUQ intermittently x 2 weeks. Patient reports she's been taking Nexium OTC x 2 weeks without relief and denies pain to be worse after eating.  Patient denies constipation, diarrhea, bloody stools, nausea, and vomiting.  Patient also denies urinary symptoms. Last bowel movement was 5:30am today.  Reports a healthy diet and drinks plenty of water.     Review of Systems  Constitutional: Negative for fever and fatigue.  HENT: Negative for congestion and sore throat.   Respiratory: Negative for cough and shortness of breath.        Patient reports mild SOB when episode occurs.  Cardiovascular: Negative for chest pain.  Gastrointestinal: Positive for abdominal pain and abdominal distention. Negative for nausea, vomiting, diarrhea, constipation and blood in stool.       LUQ abdominal pain intermittently x 2 weeks.  Feels bloated after eating intermittently.  Genitourinary: Negative for dysuria and frequency.  Musculoskeletal: Negative for back pain.  Neurological: Negative for dizziness.   Past Medical History  Diagnosis Date  . 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 Marital Status: Single    Spouse Name: N/A    Number of Children: N/A  . Years of Education: N/A   Occupational History  . Not on file.   Social History Main Topics  . Smoking status: Never Smoker   . Smokeless tobacco: Never Used  . Alcohol Use: Not on file  . Drug Use: Not on file  . Sexual Activity: Not on file   Other Topics Concern  . 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    Past Surgical History  Procedure Laterality Date  . Cesarean section      Family History   Problem Relation Age of Onset  . Arthritis Other   . Cancer Other     bladder  . Hyperlipidemia Other   . Hypertension Other   . Stroke Other     No Known Allergies  No current outpatient prescriptions on file prior to visit.   No current facility-administered medications on file prior to visit.    BP 122/82  Pulse 69  Temp(Src) 97.9 F (36.6 C) (Oral)  Resp 16  Ht 5' (1.524 m)  Wt 151 lb 1.9 oz (68.548 kg)  BMI 29.51 kg/m2  SpO2 99%        Objective:   Physical Exam  Constitutional: She is oriented to person, place, and time. She appears well-nourished.  Neck: Neck supple.  Cardiovascular: Normal rate and regular rhythm.   Pulmonary/Chest: Breath sounds normal. No respiratory distress.  Abdominal: Soft. Bowel sounds are normal. There is tenderness.  Tender to LUQ and over left rib cage  Neurological: She is alert and oriented to person, place, and time.  Skin: Skin is warm and dry.    No CVA tenderness noted. Spleen does not appear to be enlarged.      Assessment & Plan:

## 2012-11-11 ENCOUNTER — Encounter: Payer: Self-pay | Admitting: Family

## 2012-11-12 ENCOUNTER — Ambulatory Visit: Payer: 59 | Admitting: Family

## 2012-11-17 ENCOUNTER — Ambulatory Visit: Payer: 59 | Admitting: Family

## 2012-11-27 ENCOUNTER — Other Ambulatory Visit: Payer: Self-pay

## 2013-05-17 ENCOUNTER — Emergency Department (INDEPENDENT_AMBULATORY_CARE_PROVIDER_SITE_OTHER): Payer: 59

## 2013-05-17 ENCOUNTER — Encounter: Payer: Self-pay | Admitting: Emergency Medicine

## 2013-05-17 ENCOUNTER — Emergency Department (INDEPENDENT_AMBULATORY_CARE_PROVIDER_SITE_OTHER)
Admission: EM | Admit: 2013-05-17 | Discharge: 2013-05-17 | Disposition: A | Discharge status: Home / Self Care | Payer: 59 | Source: Home / Self Care | Attending: Emergency Medicine | Admitting: Emergency Medicine

## 2013-05-17 DIAGNOSIS — M25519 Pain in unspecified shoulder: Secondary | ICD-10-CM

## 2013-05-17 DIAGNOSIS — S46912A Strain of unspecified muscle, fascia and tendon at shoulder and upper arm level, left arm, initial encounter: Secondary | ICD-10-CM

## 2013-05-17 DIAGNOSIS — S40012A Contusion of left shoulder, initial encounter: Secondary | ICD-10-CM

## 2013-05-17 DIAGNOSIS — IMO0002 Reserved for concepts with insufficient information to code with codable children: Secondary | ICD-10-CM

## 2013-05-17 DIAGNOSIS — S40019A Contusion of unspecified shoulder, initial encounter: Secondary | ICD-10-CM

## 2013-05-17 HISTORY — DX: Other seasonal allergic rhinitis: J30.2

## 2013-05-17 MED ORDER — HYDROCODONE-ACETAMINOPHEN 5-325 MG PO TABS: 1.0000 | ORAL_TABLET | ORAL | Status: DC | PRN

## 2013-05-17 NOTE — ED Notes (Signed)
Patient fell down flight of stairs 2 weeks ago but did not have shoulder pain; 2 nights ago twisted and fell from chair and hurt left shoulder. Has history of dislocating that shoulder, but does not think that happened.

## 2013-05-17 NOTE — ED Provider Notes (Signed)
CSN: [id number]     Arrival date & time 05/17/13  1109 History   First MD Initiated Contact with Patient 05/17/13 1109     Chief Complaint  Patient presents with  . Shoulder Pain   Patient fell down flight of stairs 2 weeks ago but did not have shoulder pain; 2 nights ago twisted and fell from chair and hurt left shoulder. Has history of dislocating that shoulder, but does not think that happened. She denies history of physical assault.  Patient is a 28 y.o. female presenting with shoulder pain. The history is provided by the patient.  Shoulder Pain This is a new problem. The current episode started 2 days ago. The problem occurs constantly. The problem has been gradually worsening. Pertinent negatives include no chest pain, no abdominal pain, no headaches and no shortness of breath. Exacerbated by: movement. The symptoms are relieved by rest. Treatments tried: naproxen. The treatment provided no relief.   The pain in left shoulder is sharp, mostly posterior shoulder, without radiation or paresthesias or weakness. Pain intensity 6/10 at rest, 8/10 when trying to move the left shoulder. Past Medical History  Diagnosis Date  . Hepatitis C carrier   . Seasonal allergies    Past Surgical History  Procedure Laterality Date  . Cesarean section     Family History  Problem Relation Age of Onset  . Arthritis Other   . Cancer Other     bladder  . Hyperlipidemia Other   . Hypertension Other   . Stroke Other    History  Substance Use Topics  . Smoking status: Never Smoker   . Smokeless tobacco: Never Used  . Alcohol Use: Yes   OB History   Grav Para Term Preterm Abortions TAB SAB Ect Mult Living                 Review of Systems  Respiratory: Negative for shortness of breath.   Cardiovascular: Negative for chest pain.  Gastrointestinal: Negative for abdominal pain.  Neurological: Negative for headaches.  All other systems reviewed and are negative.   Allergies  Review of  patient's allergies indicates no known allergies.  Home Medications   Prior to Admission medications   Medication Sig Start Date End Date Taking? Authorizing Provider  loratadine (CLARITIN) 10 MG tablet Take 10 mg by mouth daily.    Historical Provider, MD  naproxen (NAPROSYN) 500 MG tablet Take 1 tablet (500 mg total) by mouth 2 (two) times daily with a meal. 11/10/12   Sandford CrazeMelissa O'Sullivan, NP   BP 130/82  Pulse 81  Temp(Src) 98 F (36.7 C) (Oral)  Resp 16  Ht 5\' 3"  (1.6 m)  Wt 155 lb (70.308 kg)  BMI 27.46 kg/m2  SpO2 98% Physical Exam  Nursing note and vitals reviewed. Constitutional: She is oriented to person, place, and time. She appears well-developed and well-nourished. No distress.  Appears very uncomfortable from left shoulder pain, she splints the left shoulder to avoid movement.  HENT:  Head: Normocephalic and atraumatic.  Eyes: Conjunctivae and EOM are normal. Pupils are equal, round, and reactive to light. No scleral icterus.  Neck: Normal range of motion.  Cardiovascular: Normal rate.   Pulmonary/Chest: Effort normal.  Abdominal: She exhibits no distension.  Musculoskeletal:       Left shoulder: She exhibits decreased range of motion, tenderness, bony tenderness and spasm. She exhibits no swelling, no effusion, no crepitus, no deformity and no laceration.  Diffusely tender left shoulder, but especially tender posterior left  shoulder, para scapular area. Minimal tenderness AC joint area. Nontender over clavicle or by submittal tendon.  [place] decreased range of motion to 90 which causes severe pain.  Empty can sign is positive  Neurological: She is alert and oriented to person, place, and time. She has normal strength. No cranial nerve deficit or sensory deficit.  Skin: Skin is warm. No rash noted.  Psychiatric: She has a normal mood and affect.   No bruising or red streaks  ED Course  Procedures (including critical care time) Labs Review Labs Reviewed - No  data to display  Imaging Review Dg Shoulder Left  05/17/2013   CLINICAL DATA:  Two day history of left shoulder pain, recent fall  EXAM: LEFT SHOULDER - 2+ VIEW  COMPARISON:  None.  FINDINGS: There is no evidence of fracture or dislocation. There is no evidence of arthropathy or other focal bone abnormality. Soft tissues are unremarkable.  IMPRESSION: Negative.   Electronically Signed   By: Malachy MoanHeath  McCullough M.D.   On: 05/17/2013 12:12     MDM   1. Strain of left shoulder   2. Contusion of left shoulder    X left shoulder negative for fracture or dislocation or any other bony abnormality. Reviewed this with patient. Treatment options discussed, as well as risks, benefits, alternatives. Patient voiced understanding and agreement with the following plans: Vicodin prescribed, as needed for short-term severe pain Otherwise, OTC ibuprofen. She declined any prescription nonsteroidal anti-inflammatory or any muscle relaxant. Sling/shoulder immobilizer provided for left shoulder. This significantly reduced her pain Discuss gradual increase passive range of motion exercises Heat and other symptomatic care Follow-up with orthopedist is no better one week, sooner if worse or new symptoms. Precautions discussed. Red flags discussed. Questions invited and answered. Patient voiced understanding and agreement.     Lajean Manesavid Massey, MD 05/17/13 2006

## 2013-08-05 ENCOUNTER — Ambulatory Visit (INDEPENDENT_AMBULATORY_CARE_PROVIDER_SITE_OTHER): Payer: 59 | Admitting: Family

## 2013-08-05 ENCOUNTER — Encounter: Payer: Self-pay | Admitting: Family

## 2013-08-05 VITALS — BP 120/80 | HR 74 | Temp 98.0°F | Resp 16 | Ht 60.0 in | Wt 158.0 lb

## 2013-08-05 DIAGNOSIS — R42 Dizziness and giddiness: Secondary | ICD-10-CM

## 2013-08-05 LAB — POCT CBG MONITORING: Glucose: 70 mg/dL (ref 60–200)

## 2013-08-05 MED ORDER — MECLIZINE HCL 25 MG PO TABS: 25.0000 mg | ORAL_TABLET | Freq: Three times a day (TID) | ORAL | Status: DC | PRN

## 2013-08-05 MED ORDER — ONDANSETRON 8 MG PO TBDP: 8.0000 mg | ORAL_TABLET | Freq: Three times a day (TID) | ORAL | Status: DC | PRN

## 2013-08-05 NOTE — Progress Notes (Signed)
   Subjective:    Patient ID: Wendy Nunez, female    DOB: 02/04/1985, [age] y.o.   MRN: [id number]  HPI  Pt in with reported dizziness today. Onset this morning getting ready. Pt states constant and moderate. No room spinning. Some nausea. Pt is on Mirena place April 2014. Pt has no diabetes. Pt bp ok today. No recent allergies or nasal congestion. No head trauma. No fever, no chills, no urinary infections. No diarrhea. No prior history of dizziness or vertigo. Pt not exercising heavily or dieting. About 30% better with dizziness.  No ha or gross motor/sensory function deficits reported with dizziness.   Review of Systems  Constitutional: Negative for fever, chills and fatigue.  HENT: Negative.   Respiratory: Negative for chest tightness and shortness of breath.   Cardiovascular: Negative for chest pain and palpitations.  Gastrointestinal: Negative.   Endocrine: Negative for polydipsia, polyphagia and polyuria.  Musculoskeletal: Negative.   Neurological: Positive for dizziness and light-headedness. Negative for tremors, seizures, syncope, facial asymmetry, speech difficulty, weakness, numbness and headaches.  Psychiatric/Behavioral: Negative for suicidal ideas, behavioral problems, confusion and agitation. The patient is not nervous/anxious.        Objective:   Physical Exam Note bp lying supine 120/80. General Mental Status- Alert. General Appearance- Not in acute distress.   Skin General: Color- Normal Color. Moisture- Normal Moisture.  Neck Carotid Arteries- Normal color. Moisture- Normal Moisture.  No JVD.  Chest and Lung Exam Auscultation: Breath Sounds:-Normal.  Cardiovascular Auscultation:Rythm- Regular. Murmurs & Other Heart Sounds:Auscultation of the heart reveals- No Murmurs.  Abdomen Inspection:-Inspeection Normal. Palpation/Percussion:Note:No mass. Palpation and Percussion of the abdomen reveal- Non Tender, Non Distended + BS, no rebound or  guarding.    Neurologic Cranial Nerve exam:- CN III-XII intact(No nystagmus), symmetric smile. Drift Test:- No drift. Romberg Exam:- Negative.  Finger to Nose:- Normal/Intact Strength:- 5/5 equal and symmetric strength both upper and lower extremities. Does exhibit imbalance on examining table and on eom's as well as changing positions supine to sitting.  Does ambulate well.       Assessment & Plan:  Patient seen along with Kristine GarbeEdward Saguire PA-C who is currently in orientation for training purposes. I have personally examined pt and agree with assessment and plan.

## 2013-08-05 NOTE — Progress Notes (Signed)
Pre visit review using our clinic review tool, if applicable. No additional management support is needed unless otherwise documented below in the visit note. 

## 2013-08-05 NOTE — Assessment & Plan Note (Signed)
Pt had onset today. Some nausea. Rx meclizine and zomig. Pt advised to call us on Friday for update.(Advised not to work until better) If symptoms worsen as discussed then re-eval here, UC or ED.

## 2013-08-05 NOTE — Patient Instructions (Addendum)
Continue meclizine same dose and use prn dizziness. Rest and give work note. If symptoms worsen change or persist by Friday let us know.  I did advise pt on her bs being 69.(After avs printed) To have a snack in light of her bs being borderline.  Vertigo Vertigo means you feel like you or your surroundings are moving when they are not. Vertigo can be dangerous if it occurs when you are at work, driving, or performing difficult activities.  CAUSES  Vertigo occurs when there is a conflict of signals sent to your brain from the visual and sensory systems in your body. There are many different causes of vertigo, including:  Infections, especially in the inner ear.  A bad reaction to a drug or misuse of alcohol and medicines.  Withdrawal from drugs or alcohol.  Rapidly changing positions, such as lying down or rolling over in bed.  A migraine headache.  Decreased blood flow to the brain.  Increased pressure in the brain from a head injury, infection, tumor, or bleeding. SYMPTOMS  You may feel as though the world is spinning around or you are falling to the ground. Because your balance is upset, vertigo can cause nausea and vomiting. You may have involuntary eye movements (nystagmus). DIAGNOSIS  Vertigo is usually diagnosed by physical exam. If the cause of your vertigo is unknown, your caregiver may perform imaging tests, such as an MRI scan (magnetic resonance imaging). TREATMENT  Most cases of vertigo resolve on their own, without treatment. Depending on the cause, your caregiver may prescribe certain medicines. If your vertigo is related to body position issues, your caregiver may recommend movements or procedures to correct the problem. In rare cases, if your vertigo is caused by certain inner ear problems, you may need surgery. HOME CARE INSTRUCTIONS   Follow your caregiver's instructions.  Avoid driving.  Avoid operating heavy machinery.  Avoid performing any tasks that would be  dangerous to you or others during a vertigo episode.  Tell your caregiver if you notice that certain medicines seem to be causing your vertigo. Some of the medicines used to treat vertigo episodes can actually make them worse in some people. SEEK IMMEDIATE MEDICAL CARE IF:   Your medicines do not relieve your vertigo or are making it worse.  You develop problems with talking, walking, weakness, or using your arms, hands, or legs.  You develop severe headaches.  Your nausea or vomiting continues or gets worse.  You develop visual changes.  A family member notices behavioral changes.  Your condition gets worse. MAKE SURE YOU:  Understand these instructions.  Will watch your condition.  Will get help right away if you are not doing well or get worse. Document Released: 10/18/2004 Document Revised: 04/02/2011 Document Reviewed: 07/27/2010 [facility] Patient Information 2015 [place], [place]. This information is not intended to replace advice given to you by your health care provider. Make sure you discuss any questions you have with your health care provider.

## 2013-10-19 ENCOUNTER — Encounter: Payer: Self-pay | Admitting: Medical

## 2013-10-19 ENCOUNTER — Ambulatory Visit (INDEPENDENT_AMBULATORY_CARE_PROVIDER_SITE_OTHER): Payer: 59 | Admitting: Medical

## 2013-10-19 VITALS — BP 137/84 | HR 103 | Temp 98.7°F

## 2013-10-19 DIAGNOSIS — J309 Allergic rhinitis, unspecified: Secondary | ICD-10-CM | POA: Insufficient documentation

## 2013-10-19 DIAGNOSIS — J302 Other seasonal allergic rhinitis: Secondary | ICD-10-CM

## 2013-10-19 DIAGNOSIS — J0100 Acute maxillary sinusitis, unspecified: Secondary | ICD-10-CM | POA: Insufficient documentation

## 2013-10-19 DIAGNOSIS — J3089 Other allergic rhinitis: Secondary | ICD-10-CM

## 2013-10-19 MED ORDER — CEFDINIR 300 MG PO CAPS: 300.0000 mg | ORAL_CAPSULE | Freq: Two times a day (BID) | ORAL | Status: DC

## 2013-10-19 MED ORDER — FLUTICASONE PROPIONATE 50 MCG/ACT NA SUSP: 2.0000 | Freq: Every day | NASAL | Status: DC

## 2013-10-19 MED ORDER — BENZONATATE 100 MG PO CAPS: 100.0000 mg | ORAL_CAPSULE | Freq: Three times a day (TID) | ORAL | Status: DC | PRN

## 2013-10-19 NOTE — Progress Notes (Signed)
   Subjective:    Patient ID: Wendy Nunez, female    DOB: September 02, 1985, 28 y.o.   MRN: [id number]  HPI   Pt in with nasal congestion, sinus pressure and productive cough. Pt taking mucinx. Fever of 101 yesterday. Symptoms since Friday. At first sneezing and itching eyes. Now when bends over sinus area painful with some upper teeth pain. A lot of purulent nasal drainage.  LMP- None since 2013. Uses mirena.    Review of Systems  Constitutional: Positive for fever. Negative for chills and fatigue.  HENT: Positive for congestion and sinus pressure. Negative for ear pain, facial swelling and sore throat.        Moderate to severe nasal congestion. Some teeth pain along with the sinus pressure.  Respiratory: Positive for cough. Negative for chest tightness, shortness of breath and wheezing.   Cardiovascular: Negative for chest pain and palpitations.  Gastrointestinal: Negative.   Musculoskeletal: Negative.   Neurological: Negative.   Hematological: Negative for adenopathy. Does not bruise/bleed easily.  Psychiatric/Behavioral: Negative.        Objective:   Physical Exam  General  Mental Status - Alert. General Appearance - Well groomed. Not in acute distress. Sounds extremely nasal congestion.  Skin Rashes- No Rashes.  HEENT Head- Normal. Ear Auditory Canal - Left- Normal. Right - Normal.Tympanic Membrane- Left- Red.  Right- Normal. Eye Sclera/Conjunctiva- Left- Normal. Right- Normal. Nose & Sinuses Nasal Mucosa- Left-  Boggy and Congested. Right- Boggy and Congested. Frontal and maxillary sinus pressure to palpation. Mouth & Throat Lips: Upper Lip- Normal: no dryness, cracking, pallor, cyanosis, or vesicular eruption. Lower Lip-Normal: no dryness, cracking, pallor, cyanosis or vesicular eruption. Buccal Mucosa- Bilateral- No Aphthous ulcers. Oropharynx- No Discharge or Erythema. Tonsils: Characteristics- Bilateral- No Erythema or Congestion. Size/Enlargement- Bilateral- No  enlargement. Discharge- bilateral-None.  Neck Neck- Supple. No Masses.   Chest and Lung Exam Auscultation: Breath Sounds:-Normal.  Cardiovascular Auscultation:Rythm- Regular.  Murmurs & Other Heart Sounds:Ausculatation of the heart reveal- No Murmurs.  Lymphatic Head & Neck General Head & Neck Lymphatics: Bilateral: Description- No Localized lymphadenopathy.        Assessment & Plan:

## 2013-10-19 NOTE — Assessment & Plan Note (Signed)
Prescription of Cefdinir today. She does have some associated cough and it did make prescription of benzonatate available if needed.

## 2013-10-19 NOTE — Assessment & Plan Note (Signed)
Started with mild allergic rhinitis symptoms. Did prescribe fluticasone nasal spray today.

## 2013-10-19 NOTE — Patient Instructions (Addendum)
You appear to have had allergic rhinitis with subsequent sinusitis.   Recommend stopping afrin otc. I will prescribe fluticasone for congestion and cefdinir antibiotic for sinus infections(as well as left om).  I sent benzonatate for cough.  Follow up in 7-10 days or as needed.  Note I did discouraged from using any further Afrin. If her severe nasal congestion persists toward the end of the week then would consider giving steroid injection to help alleviate this symptom. She will let us know later in the week as she is feeling

## 2013-11-06 ENCOUNTER — Other Ambulatory Visit: Payer: Self-pay

## 2014-01-11 ENCOUNTER — Telehealth: Payer: Self-pay | Admitting: Family

## 2014-01-11 DIAGNOSIS — J069 Acute upper respiratory infection, unspecified: Secondary | ICD-10-CM

## 2014-01-11 DIAGNOSIS — R059 Cough, unspecified: Secondary | ICD-10-CM

## 2014-01-11 DIAGNOSIS — R05 Cough: Secondary | ICD-10-CM

## 2014-01-11 MED ORDER — BENZONATATE 200 MG PO CAPS: 200.0000 mg | ORAL_CAPSULE | Freq: Three times a day (TID) | ORAL | Status: DC | PRN

## 2014-01-11 MED ORDER — AZITHROMYCIN 250 MG PO TABS: ORAL_TABLET | ORAL | Status: DC

## 2014-01-11 NOTE — Progress Notes (Signed)
Please see other E-Visit for cough.

## 2014-01-11 NOTE — Progress Notes (Signed)
We are sorry that you are not feeling well.  Here is how we plan to help!  Based on what you have shared with me it looks like you have upper respiratory tract inflammation that has resulted in a signification cough.  Inflammation and infection in the upper respiratory tract is commonly called bronchitis and has four common causes:  Allergies, Viral Infections, Acid Reflux and Bacterial Infections.  Allergies, viruses and acid reflux are treated by controlling symptoms or eliminating the cause. An example might be a cough caused by taking certain blood pressure medications. You stop the cough by changing the medication. Another example might be a cough caused by acid reflux. Controlling the reflux helps control the cough.  Based on your presentation I believe you most likely have A cough due to bacteria.  When patients have a fever and a productive cough with a change in color or increased sputum production, we are concerned about bacterial bronchitis.  If left untreated it can progress to pneumonia.  If your symptoms do not improve with your treatment plan it is important that you contact your provider.   I have prescribed Azithromyin 250 mg: two tables now and then one tablet daily for 4 additonal days   In addition you may use A non-prescription cough medication called Robitussin DAC. Take 2 teaspoons every 8 hours or Delsym: take 2 teaspoons every 12 hours., A non-prescription cough medication called Mucinex DM: take 2 tablets every 12 hours. and A prescription cough medication called Tessalon Perles 100mg. You may take 1-2 capsules every 8 hours as needed for your cough.    HOME CARE . Only take medications as instructed by your medical team. . Complete the entire course of an antibiotic. . Drink plenty of fluids and get plenty of rest. . Avoid close contacts especially the very young and the elderly . Cover your mouth if you cough or cough into your sleeve. . Always remember to wash your  hands . A steam or ultrasonic humidifier can help congestion.    GET HELP RIGHT AWAY IF: . You develop worsening fever. . You become short of breath . You cough up blood. . Your symptoms persist after you have completed your treatment plan MAKE SURE YOU   Understand these instructions.  Will watch your condition.  Will get help right away if you are not doing well or get worse.  Your e-visit answers were reviewed by a board certified advanced clinical practitioner to complete your personal care plan.  Depending on the condition, your plan could have included both over the counter or prescription medications.  Please review your pharmacy choice.  If there is a problem, you may call our nursing hot line at [phone] and have the prescription routed to another pharmacy.  Your safety is important to us.  If you have drug allergies check your prescription carefully.    You can use MyChart to ask questions about today's visit, request a non-urgent call back, or ask for a work or school excuse.  You will get an e-mail in the next two days asking about your experience.  I hope that your e-visit has been valuable and will speed your recovery. Thank you for using e-visits.   

## 2014-02-19 ENCOUNTER — Encounter: Payer: Self-pay | Admitting: Medical

## 2014-02-19 ENCOUNTER — Ambulatory Visit (INDEPENDENT_AMBULATORY_CARE_PROVIDER_SITE_OTHER): Payer: 59 | Admitting: Medical

## 2014-02-19 VITALS — BP 125/92 | HR 80 | Temp 98.2°F | Ht 60.0 in | Wt 160.6 lb

## 2014-02-19 DIAGNOSIS — L739 Follicular disorder, unspecified: Secondary | ICD-10-CM

## 2014-02-19 MED ORDER — DOXYCYCLINE HYCLATE 100 MG PO TABS: 100.0000 mg | ORAL_TABLET | Freq: Two times a day (BID) | ORAL | Status: DC

## 2014-02-19 NOTE — Patient Instructions (Signed)
Your skin on back of your scalp/head appears to have mild folliculitis. I will rx doxycycline.  Based on your lack of response to nsaids and flexeril, I don't think muscular/trapezius pain.  Also I don't think you have intracranial condition. If you were to get ha or other neurologic symptoms then we would work up that differential.  Follow up in 10 days or as needed.

## 2014-02-19 NOTE — Assessment & Plan Note (Signed)
Your skin on back of your scalp/head appears to have mild folliculitis. I will rx doxycycline.  Based on your lack of response to nsaids and flexeril, I don't think muscular/trapezius pain.  Also I don't think you have intracranial condition. If you were to get ha or other neurologic symptoms then we would work up that differential.  Will reasses dx if no response to doxycycline.

## 2014-02-19 NOTE — Progress Notes (Signed)
Pre visit review using our clinic review tool, if applicable. No additional management support is needed unless otherwise documented below in the visit note. 

## 2014-02-19 NOTE — Progress Notes (Signed)
Subjective:    Patient ID: Wendy Nunez, female    DOB: 03/01/1985, 29 y.o.   MRN: [id number]  HPI   Pt in with some neck pain. But then she point to base of her occipital area x 6 months. Pt tried tylenol, ibuprofen and flexeril.  Pt has tried massage as well. Helped briefly. Pt does not report neck or upper back pain. No radicular pain.  Pt states has seen PT in the past for shoulder pain in the past. No recent shoulder pain.      Review of Systems  Constitutional: Negative for fever, chills and fatigue.  HENT: Negative.   Respiratory: Negative for choking, chest tightness, shortness of breath and wheezing.   Cardiovascular: Negative for chest pain and palpitations.  Genitourinary: Negative.   Musculoskeletal:       At junction of trapezius/occipital area. Pain.  Skin:       Area occipital area tender to palpation.  Neurological: Negative for dizziness, seizures, syncope, facial asymmetry, speech difficulty, weakness, light-headedness, numbness and headaches.       No dizziness recently but some transient in the past.  Hematological: Negative for adenopathy. Does not bruise/bleed easily.  Psychiatric/Behavioral: Negative for behavioral problems and confusion.   Past Medical History  Diagnosis Date  . Hepatitis C carrier   . Seasonal allergies     History   Social History  . Marital Status: Single    Spouse Name: N/A    Number of Children: N/A  . Years of Education: N/A   Occupational History  . Not on file.   Social History Main Topics  . Smoking status: Never Smoker   . Smokeless tobacco: Never Used  . Alcohol Use: Yes  . Drug Use: No  . Sexual Activity: Not on file   Other Topics Concern  . 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    Past Surgical History  Procedure Laterality Date  . Cesarean section      Family History  Problem Relation Age of Onset  .  Arthritis Other   . Cancer Other     bladder  . Hyperlipidemia Other   . Hypertension Other   . Stroke Other     No Known Allergies  Current Outpatient Prescriptions on File Prior to Visit  Medication Sig Dispense Refill  . azithromycin (ZITHROMAX) 250 MG tablet Take 500 mg once, then 250 mg for four days (Patient not taking: Reported on 02/19/2014) 6 tablet 0  . benzonatate (TESSALON) 100 MG capsule Take 1 capsule (100 mg total) by mouth 3 (three) times daily as needed for cough. (Patient not taking: Reported on 02/19/2014) 21 capsule 0  . benzonatate (TESSALON) 200 MG capsule Take 1 capsule (200 mg total) by mouth 3 (three) times daily as needed. (Patient not taking: Reported on 02/19/2014) 30 capsule 1  . cefdinir (OMNICEF) 300 MG capsule Take 1 capsule (300 mg total) by mouth 2 (two) times daily. (Patient not taking: Reported on 02/19/2014) 20 capsule 0  . fluticasone (FLONASE) 50 MCG/ACT nasal spray Place 2 sprays into both nostrils daily. 16 g 1  . loratadine (CLARITIN) 10 MG tablet Take 10 mg by mouth daily.    . meclizine (ANTIVERT) 25 MG tablet Take 1 tablet (25 mg total) by mouth 3 (three) times daily as needed for dizziness. 30 tablet 0  . ondansetron (  ZOFRAN ODT) 8 MG disintegrating tablet Take 1 tablet (8 mg total) by mouth every 8 (eight) hours as needed for nausea or vomiting. 9 tablet 0   No current facility-administered medications on file prior to visit.    BP 125/92 mmHg  Pulse 80  Temp(Src) 98.2 F (36.8 C) (Oral)  Ht 5' (1.524 m)  Wt 160 lb 9.6 oz (72.848 kg)  BMI 31.37 kg/m2  SpO2 95%       Objective:   Physical Exam   General Mental Status- Alert. General Appearance- Not in acute distress.   Skin General: Color- Normal Color. Moisture- Normal Moisture. Area of trapezius/occipital junction has faint pinkish red appearance of follicles.(this was shown to pt husband)  Neck Carotid Arteries- Normal color. Moisture- Normal Moisture. No carotid bruits. No  JVD. No pain on palpatin of her neck. Only back of head pain(on palpatin) where trapezius inserts into occiptial area  Chest and Lung Exam Auscultation: Breath Sounds:-Normal.  Cardiovascular Auscultation:Rythm- Regular. Murmurs & Other Heart Sounds:Auscultation of the heart reveals- No Murmurs.    Neurologic Cranial Nerve exam:- CN III-XII intact(No nystagmus), symmetric smile. Drift Test:- No drift. Romberg Exam:- Negative.  Heal to Toe Gait exam:-Normal. Finger to Nose:- Normal/Intact Strength:- 5/5 equal and symmetric strength both upper and lower extremities.  Skin- at junction of occipital and trapezius. Scattered mild faint folliculitis appearance. She is tender on palpation.         Assessment & Plan:

## 2014-03-08 ENCOUNTER — Encounter: Payer: 59 | Admitting: Medical

## 2014-03-08 NOTE — Progress Notes (Signed)
This encounter was created in error - please disregard.

## 2014-05-03 ENCOUNTER — Ambulatory Visit (HOSPITAL_BASED_OUTPATIENT_CLINIC_OR_DEPARTMENT_OTHER)
Admission: RE | Admit: 2014-05-03 | Discharge: 2014-05-03 | Disposition: A | Discharge status: Home / Self Care | Payer: 59 | Source: Ambulatory Visit | Attending: Medical | Admitting: Medical

## 2014-05-03 ENCOUNTER — Encounter: Payer: Self-pay | Admitting: Medical

## 2014-05-03 ENCOUNTER — Ambulatory Visit (INDEPENDENT_AMBULATORY_CARE_PROVIDER_SITE_OTHER): Payer: 59 | Admitting: Medical

## 2014-05-03 VITALS — BP 144/78 | HR 99 | Temp 99.5°F | Ht 60.0 in | Wt 163.2 lb

## 2014-05-03 DIAGNOSIS — J209 Acute bronchitis, unspecified: Secondary | ICD-10-CM

## 2014-05-03 DIAGNOSIS — R05 Cough: Secondary | ICD-10-CM

## 2014-05-03 DIAGNOSIS — R509 Fever, unspecified: Secondary | ICD-10-CM | POA: Diagnosis not present

## 2014-05-03 DIAGNOSIS — R0989 Other specified symptoms and signs involving the circulatory and respiratory systems: Secondary | ICD-10-CM | POA: Diagnosis not present

## 2014-05-03 DIAGNOSIS — R059 Cough, unspecified: Secondary | ICD-10-CM

## 2014-05-03 LAB — POCT INFLUENZA A/B
Influenza A, POC: NEGATIVE
Influenza B, POC: NEGATIVE

## 2014-05-03 MED ORDER — HYDROCOD POLST-CHLORPHEN POLST 10-8 MG/5ML PO LQCR: 5.0000 mL | Freq: Two times a day (BID) | ORAL | Status: DC | PRN

## 2014-05-03 MED ORDER — ALBUTEROL SULFATE HFA 108 (90 BASE) MCG/ACT IN AERS: 2.0000 | INHALATION_SPRAY | Freq: Four times a day (QID) | RESPIRATORY_TRACT | Status: DC | PRN

## 2014-05-03 MED ORDER — BECLOMETHASONE DIPROPIONATE 40 MCG/ACT IN AERS: 2.0000 | INHALATION_SPRAY | Freq: Two times a day (BID) | RESPIRATORY_TRACT | Status: DC

## 2014-05-03 MED ORDER — AZITHROMYCIN 250 MG PO TABS: ORAL_TABLET | ORAL | Status: DC

## 2014-05-03 NOTE — Assessment & Plan Note (Signed)
With transient occasional wheeze. Will rx qvar and albuterol. Since severe dry cough may be reactive airway sign.  Tussuionex as well.  Please get cxr today.

## 2014-05-03 NOTE — Progress Notes (Signed)
Subjective:    Patient ID: Wendy Nunez, female    DOB: 18-Oct-1985, 29 y.o.   MRN: [id number]  HPI  Pt in with cough for 3 wks. Pt states cough so bad now that she is gagging. She is not sleeping. Occasional transient faint wheeze.No obvious allergy type symptoms. Only mild nasal congestion.   Last week some fever. No recent fevers. Some bodyaches. Mostly ribs and feels like it is from coughing.  LMP- Pt has IUD.    Review of Systems  Constitutional: Negative for fever, chills and fatigue.  HENT: Positive for congestion.   Respiratory: Positive for cough and wheezing. Negative for chest tightness.        Trransient very rare wheeze.  Cardiovascular: Negative for chest pain and palpitations.  Musculoskeletal: Negative for back pain.       Ribs sore from coughing.  Hematological: Negative for adenopathy. Does not bruise/bleed easily.  Psychiatric/Behavioral: Negative for behavioral problems and confusion.   Past Medical History  Diagnosis Date  . Hepatitis C carrier   . Seasonal allergies     History   Social History  . Marital Status: Single    Spouse Name: N/A  . Number of Children: N/A  . Years of Education: N/A   Occupational History  . Not on file.   Social History Main Topics  . Smoking status: Never Smoker   . Smokeless tobacco: Never Used  . Alcohol Use: Yes  . Drug Use: No  . Sexual Activity: Not on file   Other Topics Concern  . 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    Past Surgical History  Procedure Laterality Date  . Cesarean section      Family History  Problem Relation Age of Onset  . Arthritis Other   . Cancer Other     bladder  . Hyperlipidemia Other   . Hypertension Other   . Stroke Other     No Known Allergies  Current Outpatient Prescriptions on File Prior to Visit  Medication Sig Dispense Refill  . fluticasone (FLONASE) 50 MCG/ACT nasal  spray Place 2 sprays into both nostrils daily. (Patient not taking: Reported on 05/03/2014) 16 g 1  . loratadine (CLARITIN) 10 MG tablet Take 10 mg by mouth daily.     No current facility-administered medications on file prior to visit.    BP 144/78 mmHg  Pulse 99  Temp(Src) 99.5 F (37.5 C) (Oral)  Ht 5' (1.524 m)  Wt 163 lb 3.2 oz (74.027 kg)  BMI 31.87 kg/m2  SpO2 99%      Objective:   Physical Exam  General  Mental Status - Alert. General Appearance - Well groomed. Not in acute distress.(But coughs during entire interview)  Skin Rashes- No Rashes.  HEENT Head- Normal. Ear Auditory Canal - Left- Normal. Right - Normal.Tympanic Membrane- Left- Normal. Right- Normal. Eye Sclera/Conjunctiva- Left- Normal. Right- Normal. Nose & Sinuses Nasal Mucosa- Left-  Not boggy or Congested. Right-  Not  boggy or Congested. Mouth & Throat Lips: Upper Lip- Normal: no dryness, cracking, pallor, cyanosis, or vesicular eruption. Lower Lip-Normal: no dryness, cracking, pallor, cyanosis or vesicular eruption. Buccal Mucosa- Bilateral- No Aphthous ulcers. Oropharynx- No Discharge or Erythema. +pnd. Tonsils: Characteristics- Bilateral- No Erythema or Congestion. Size/Enlargement- Bilateral- No enlargement. Discharge- bilateral-None.  Neck Neck- Supple. No Masses. No tracheal deviation.   Chest  and Lung Exam Auscultation: Breath Sounds:- even and unlabored, but shallow respirations.   Cardiovascular Auscultation:Rythm- Regular, rate and rhythm. Murmurs & Other Heart Sounds:Ausculatation of the heart reveal- No Murmurs.  Lymphatic Head & Neck General Head & Neck Lymphatics: Bilateral: Description- No Localized lymphadenopathy.       Assessment & Plan:

## 2014-05-03 NOTE — Assessment & Plan Note (Addendum)
You appear to have bronchitis. Rest hydrate and tylenol for fever. I am prescribing cough medicine tussionex , and azithrmycin  antibiotic.    Follow up in 7-10 days or as needed

## 2014-05-03 NOTE — Progress Notes (Signed)
Pre visit review using our clinic review tool, if applicable. No additional management support is needed unless otherwise documented below in the visit note. 

## 2014-05-03 NOTE — Patient Instructions (Signed)
Acute bronchitis You appear to have bronchitis. Rest hydrate and tylenol for fever. I am prescribing cough medicine tussionex , and azithrmycin  antibiotic.    Follow up in 7-10 days or as needed   Cough With transient occasional wheeze. Will rx qvar and albuterol. Since severe dry cough may be reactive airway sign.  Tussuionex as well.  Please get cxr today.    Recommend flonase otc to help with possible allergy component since some PND on exam.

## 2014-06-23 ENCOUNTER — Encounter: Payer: Self-pay | Admitting: Medical

## 2014-06-23 ENCOUNTER — Ambulatory Visit (INDEPENDENT_AMBULATORY_CARE_PROVIDER_SITE_OTHER): Payer: 59 | Admitting: Medical

## 2014-06-23 VITALS — BP 131/84 | HR 87 | Temp 98.2°F | Ht 60.0 in | Wt 167.4 lb

## 2014-06-23 DIAGNOSIS — R1013 Epigastric pain: Secondary | ICD-10-CM

## 2014-06-23 DIAGNOSIS — R109 Unspecified abdominal pain: Secondary | ICD-10-CM | POA: Insufficient documentation

## 2014-06-23 LAB — CBC WITH DIFFERENTIAL/PLATELET
Basophils Absolute: 0.1 10*3/uL (ref 0.0–0.1)
Basophils Relative: 0.4 % (ref 0.0–3.0)
EOS ABS: 0.3 10*3/uL (ref 0.0–0.7)
Eosinophils Relative: 2.6 % (ref 0.0–5.0)
HEMATOCRIT: 42.2 % (ref 36.0–46.0)
Hemoglobin: 13.9 g/dL (ref 12.0–15.0)
Lymphocytes Relative: 18.6 % (ref 12.0–46.0)
Lymphs Abs: 2.3 10*3/uL (ref 0.7–4.0)
MCHC: 33 g/dL (ref 30.0–36.0)
MCV: 90.9 fl (ref 78.0–100.0)
Monocytes Absolute: 0.8 10*3/uL (ref 0.1–1.0)
Monocytes Relative: 6.4 % (ref 3.0–12.0)
NEUTROS PCT: 72 % (ref 43.0–77.0)
Neutro Abs: 9 10*3/uL — ABNORMAL HIGH (ref 1.4–7.7)
PLATELETS: 372 10*3/uL (ref 150.0–400.0)
RBC: 4.64 Mil/uL (ref 3.87–5.11)
RDW: 13.6 % (ref 11.5–15.5)
WBC: 12.6 10*3/uL — ABNORMAL HIGH (ref 4.0–10.5)

## 2014-06-23 LAB — COMPREHENSIVE METABOLIC PANEL
ALBUMIN: 4.5 g/dL (ref 3.5–5.2)
ALT: 12 U/L (ref 0–35)
AST: 15 U/L (ref 0–37)
Alkaline Phosphatase: 76 U/L (ref 39–117)
BUN: 9 mg/dL (ref 6–23)
CO2: 31 meq/L (ref 19–32)
Calcium: 9.1 mg/dL (ref 8.4–10.5)
Chloride: 102 mEq/L (ref 96–112)
Creatinine, Ser: 0.83 mg/dL (ref 0.40–1.20)
GFR: 85.8 mL/min (ref >60.00)
Glucose, Bld: 93 mg/dL (ref 70–99)
Potassium: 3.7 mEq/L (ref 3.5–5.1)
Sodium: 137 mEq/L (ref 135–145)
Total Bilirubin: 0.3 mg/dL (ref 0.2–1.2)
Total Protein: 7.6 g/dL (ref 6.0–8.3)

## 2014-06-23 LAB — H. PYLORI ANTIBODY, IGG: H Pylori IgG: NEGATIVE

## 2014-06-23 LAB — AMYLASE: Amylase: 47 U/L (ref 27–131)

## 2014-06-23 LAB — LIPASE: Lipase: 12 U/L (ref 11.0–59.0)

## 2014-06-23 MED ORDER — RANITIDINE HCL 150 MG PO CAPS: 150.0000 mg | ORAL_CAPSULE | Freq: Two times a day (BID) | ORAL | Status: DC

## 2014-06-23 NOTE — Patient Instructions (Addendum)
Abdominal pain Likely gerd. Will rx ranitidine 150 mg to take twice daily. Continue protonix. Labs cbc, cmp, lipase, amylase, and hpylori.  If symptoms persist with above tx and lab negative consider referral to EGD.  Follow up in 2 wks or as needed.    Food Choices for Gastroesophageal Reflux Disease When you have gastroesophageal reflux disease (GERD), the foods you eat and your eating habits are very important. Choosing the right foods can help ease the discomfort of GERD. WHAT GENERAL GUIDELINES DO I NEED TO FOLLOW?  Choose fruits, vegetables, whole grains, low-fat dairy products, and low-fat meat, fish, and poultry.  Limit fats such as oils, salad dressings, butter, nuts, and avocado.  Keep a food diary to identify foods that cause symptoms.  Avoid foods that cause reflux. These may be different for different people.  Eat frequent small meals instead of three large meals each day.  Eat your meals slowly, in a relaxed setting.  Limit fried foods.  Cook foods using methods other than frying.  Avoid drinking alcohol.  Avoid drinking large amounts of liquids with your meals.  Avoid bending over or lying down until 2-3 hours after eating. WHAT FOODS ARE NOT RECOMMENDED? The following are some foods and drinks that may worsen your symptoms: Vegetables Tomatoes. Tomato juice. Tomato and spaghetti sauce. Chili peppers. Onion and garlic. Horseradish. Fruits Oranges, grapefruit, and lemon (fruit and juice). Meats High-fat meats, fish, and poultry. This includes hot dogs, ribs, ham, sausage, salami, and bacon. Dairy Whole milk and chocolate milk. Sour cream. Cream. Butter. Ice cream. Cream cheese.  Beverages Coffee and tea, with or without caffeine. Carbonated beverages or energy drinks. Condiments Hot sauce. Barbecue sauce.  Sweets/Desserts Chocolate and cocoa. Donuts. Peppermint and spearmint. Fats and Oils High-fat foods, including [place] fries and potato  chips. Other Vinegar. Strong spices, such as black pepper, white pepper, red pepper, cayenne, curry powder, cloves, ginger, and chili powder. The items listed above may not be a complete list of foods and beverages to avoid. Contact your dietitian for more information. Document Released: 01/08/2005 Document Revised: 01/13/2013 Document Reviewed: 11/12/2012 [facility] Patient Information 2015 [place]. This information is not intended to replace advice given to you by your health care provider. Make sure you discuss any questions you have with your health care provider.

## 2014-06-23 NOTE — Progress Notes (Addendum)
Subjective:    Patient ID: Wendy Nunez, female    DOB: 12-07-1985, 29 y.o.   MRN: [id number]  HPI  Pt in with some recent severe heart burn(For one month). She can feel epigastric burning up to her throat. Recent dry cough and a lot of burping. Pt has been taking nexium otc 1 tab. Then she tried some omeprazole. Pt had e visit and was given protonix. But he gave pt differential dx. She tried on tablet of protonix but did not help much.(overall none of the PPI are helping)  Pt had some heartburn in past when pregnant.   LMP- Started yesterday.(expected time) 2 days ago pregnancy test was negative.  Pt states diet moderate healthy. No sodas. 1 cup coffee in am. No fried foods. No chocalate. No smoker. Occasinoal alcohol(minimal amounts)   Review of Systems  Constitutional: Negative for fever, chills, diaphoresis, activity change and fatigue.  Respiratory: Negative for cough, chest tightness and shortness of breath.   Cardiovascular: Negative for chest pain, palpitations and leg swelling.  Gastrointestinal: Positive for abdominal pain. Negative for nausea and vomiting.       See hop.  Musculoskeletal: Negative for neck pain and neck stiffness.  Neurological: Negative for dizziness, tremors, seizures, syncope, facial asymmetry, speech difficulty, weakness, light-headedness, numbness and headaches.  Psychiatric/Behavioral: Negative for behavioral problems, confusion and agitation. The patient is not nervous/anxious.      Past Medical History  Diagnosis Date  . Hepatitis C carrier   . Seasonal allergies     History   Social History  . Marital Status: Single    Spouse Name: N/A  . Number of Children: N/A  . Years of Education: N/A   Occupational History  . Not on file.   Social History Main Topics  . Smoking status: Never Smoker   . Smokeless tobacco: Never Used  . Alcohol Use: Yes  . Drug Use: No  . Sexual Activity: Not on file   Other Topics Concern  . 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    Past Surgical History  Procedure Laterality Date  . Cesarean section      Family History  Problem Relation Age of Onset  . Arthritis Other   . Cancer Other     bladder  . Hyperlipidemia Other   . Hypertension Other   . Stroke Other     No Known Allergies  Current Outpatient Prescriptions on File Prior to Visit  Medication Sig Dispense Refill  . albuterol (PROVENTIL HFA;VENTOLIN HFA) 108 (90 BASE) MCG/ACT inhaler Inhale 2 puffs into the lungs every 6 (six) hours as needed for wheezing or shortness of breath. 1 Inhaler 0  . beclomethasone (QVAR) 40 MCG/ACT inhaler Inhale 2 puffs into the lungs 2 (two) times daily. 1 Inhaler 1  . fluticasone (FLONASE) 50 MCG/ACT nasal spray Place 2 sprays into both nostrils daily. 16 g 1  . loratadine (CLARITIN) 10 MG tablet Take 10 mg by mouth daily.     No current facility-administered medications on file prior to visit.    BP 131/84 mmHg  Pulse 87  Temp(Src) 98.2 F (36.8 C) (Oral)  Ht 5' (1.524 m)  Wt 167 lb 6.4 oz (75.932 kg)  BMI 32.69 kg/m2  SpO2 99%  LMP 06/22/2014       Objective:   Physical Exam   General Appearance-  Not in acute distress.  HEENT Eyes- Scleraeral/Conjuntiva-bilat- Not Yellow. Mouth & Throat- Normal.  Chest and Lung Exam Auscultation: Breath sounds:-Normal. Adventitious sounds:- No Adventitious sounds.  Cardiovascular Auscultation:Rythm - Regular. Heart Sounds -Normal heart sounds.  Abdomen Inspection:-Inspection Normal.  Palpation/Perucssion: Palpation and Percussion of the abdomen reveal-  Mild epigastric  Tender, No Rebound tenderness, No rigidity(Guarding) and No Palpable abdominal masses.  Liver:-Normal.  Spleen:- Normal.   Back- no cva tenderness.       Assessment & Plan:

## 2014-06-23 NOTE — Progress Notes (Signed)
Pre visit review using our clinic review tool, if applicable. No additional management support is needed unless otherwise documented below in the visit note. 

## 2014-06-23 NOTE — Assessment & Plan Note (Signed)
Likely gerd. Will rx ranitidine 150 mg to take twice daily. Continue protonix. Labs cbc, cmp, lipase, amylase, and hpylori.  If symptoms persist with above tx and lab negative consider referral to EGD.  Follow up in 2 wks or as needed.

## 2014-06-24 ENCOUNTER — Telehealth: Payer: Self-pay | Admitting: Family

## 2014-06-24 DIAGNOSIS — R1084 Generalized abdominal pain: Secondary | ICD-10-CM

## 2014-06-24 NOTE — Telephone Encounter (Signed)
Caller name: Velda ShellCrystal Johnson Relationship to patient: self Can be reached: [phone]  Reason for call: Pt still having pain. She called to say she wants abdominal US. Does she need scheduled for repeat CBC?

## 2014-06-25 NOTE — Telephone Encounter (Signed)
Per Ramon DredgeEdward, ok to order without additional labs. Orders entered patient notified.

## 2014-06-28 ENCOUNTER — Ambulatory Visit (HOSPITAL_BASED_OUTPATIENT_CLINIC_OR_DEPARTMENT_OTHER)
Admission: RE | Admit: 2014-06-28 | Discharge: 2014-06-28 | Disposition: A | Discharge status: Home / Self Care | Payer: 59 | Source: Ambulatory Visit | Attending: Medical | Admitting: Medical

## 2014-06-28 DIAGNOSIS — N8320 Unspecified ovarian cysts: Secondary | ICD-10-CM | POA: Diagnosis not present

## 2014-06-28 DIAGNOSIS — D72829 Elevated white blood cell count, unspecified: Secondary | ICD-10-CM | POA: Diagnosis not present

## 2014-06-28 DIAGNOSIS — R1084 Generalized abdominal pain: Secondary | ICD-10-CM | POA: Diagnosis not present

## 2014-09-15 ENCOUNTER — Ambulatory Visit: Payer: 59 | Admitting: Medical

## 2014-09-16 ENCOUNTER — Encounter: Payer: Self-pay | Admitting: Medical

## 2014-09-16 ENCOUNTER — Ambulatory Visit (HOSPITAL_BASED_OUTPATIENT_CLINIC_OR_DEPARTMENT_OTHER)
Admission: RE | Admit: 2014-09-16 | Discharge: 2014-09-16 | Disposition: A | Discharge status: Home / Self Care | Payer: 59 | Source: Ambulatory Visit | Attending: Medical | Admitting: Medical

## 2014-09-16 ENCOUNTER — Ambulatory Visit (INDEPENDENT_AMBULATORY_CARE_PROVIDER_SITE_OTHER): Payer: 59 | Admitting: Medical

## 2014-09-16 VITALS — BP 110/60 | HR 100 | Temp 97.9°F | Resp 16 | Ht 60.0 in | Wt 164.2 lb

## 2014-09-16 DIAGNOSIS — J029 Acute pharyngitis, unspecified: Secondary | ICD-10-CM

## 2014-09-16 DIAGNOSIS — R062 Wheezing: Secondary | ICD-10-CM

## 2014-09-16 DIAGNOSIS — D72829 Elevated white blood cell count, unspecified: Secondary | ICD-10-CM

## 2014-09-16 DIAGNOSIS — J208 Acute bronchitis due to other specified organisms: Secondary | ICD-10-CM

## 2014-09-16 DIAGNOSIS — R05 Cough: Secondary | ICD-10-CM | POA: Diagnosis not present

## 2014-09-16 DIAGNOSIS — J0100 Acute maxillary sinusitis, unspecified: Secondary | ICD-10-CM | POA: Diagnosis not present

## 2014-09-16 DIAGNOSIS — R509 Fever, unspecified: Secondary | ICD-10-CM | POA: Insufficient documentation

## 2014-09-16 LAB — POCT RAPID STREP A (OFFICE): Rapid Strep A Screen: NEGATIVE

## 2014-09-16 LAB — CBC WITH DIFFERENTIAL/PLATELET
BASOS ABS: 0 10*3/uL (ref 0.0–0.1)
Basophils Relative: 0.3 % (ref 0.0–3.0)
EOS ABS: 0.7 10*3/uL (ref 0.0–0.7)
Eosinophils Relative: 4.6 % (ref 0.0–5.0)
HCT: 42.3 % (ref 36.0–46.0)
HEMOGLOBIN: 14.2 g/dL (ref 12.0–15.0)
Lymphocytes Relative: 13.6 % (ref 12.0–46.0)
Lymphs Abs: 2 10*3/uL (ref 0.7–4.0)
MCHC: 33.4 g/dL (ref 30.0–36.0)
MCV: 90.4 fl (ref 78.0–100.0)
Monocytes Absolute: 1.3 10*3/uL — ABNORMAL HIGH (ref 0.1–1.0)
Monocytes Relative: 9 % (ref 3.0–12.0)
Neutro Abs: 10.7 10*3/uL — ABNORMAL HIGH (ref 1.4–7.7)
Neutrophils Relative %: 72.5 % (ref 43.0–77.0)
Platelets: 345 10*3/uL (ref 150.0–400.0)
RBC: 4.69 Mil/uL (ref 3.87–5.11)
RDW: 13.1 % (ref 11.5–15.5)
WBC: 14.8 10*3/uL — AB (ref 4.0–10.5)

## 2014-09-16 MED ORDER — AZITHROMYCIN 250 MG PO TABS: ORAL_TABLET | ORAL | Status: DC

## 2014-09-16 MED ORDER — HYDROCODONE-HOMATROPINE 5-1.5 MG/5ML PO SYRP: 5.0000 mL | ORAL_SOLUTION | Freq: Three times a day (TID) | ORAL | Status: DC | PRN

## 2014-09-16 MED ORDER — CEFTRIAXONE SODIUM 1 G IJ SOLR
1.0000 g | Freq: Once | INTRAMUSCULAR | Status: AC
  Administered 2014-09-16: 1 g via INTRAMUSCULAR

## 2014-09-16 NOTE — Progress Notes (Signed)
Subjective:    Patient ID: Wendy Nunez, female    DOB: Nov 06, 1985, 29 y.o.   MRN: [id number]  HPI  Pt in with cough, chest congestion and nasal congestion. Pt has been coughing up brown colored mucous. Pt has felt febrile. Pt states symptoms since Monday. Pt also had st early on. Some sinus pressure.  Faint minimal wheeze yesterday per husband.  LMP- last Friday.  Review of Systems  Constitutional: Positive for fever. Negative for chills and fatigue.       Tuesday 102 fever. Yesterday 101 temp.  HENT: Positive for sore throat.        Early on st but none now.  Respiratory: Positive for cough and wheezing. Negative for chest tightness and shortness of breath.   Cardiovascular: Negative for chest pain and palpitations.  Musculoskeletal: Negative for back pain.  Neurological: Negative for dizziness, seizures, numbness and headaches.  Hematological: Negative for adenopathy. Does not bruise/bleed easily.    Past Medical History  Diagnosis Date  . Hepatitis C carrier   . Seasonal allergies     Social History   Social History  . Marital Status: Single    Spouse Name: N/A  . Number of Children: N/A  . Years of Education: N/A   Occupational History  . Not on file.   Social History Main Topics  . Smoking status: Never Smoker   . Smokeless tobacco: Never Used  . Alcohol Use: Yes  . Drug Use: No  . Sexual Activity: Not on file   Other Topics Concern  . 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    Past Surgical History  Procedure Laterality Date  . Cesarean section      Family History  Problem Relation Age of Onset  . Arthritis Other   . Cancer Other     bladder  . Hyperlipidemia Other   . Hypertension Other   . Stroke Other     No Known Allergies  Current Outpatient Prescriptions on File Prior to Visit  Medication Sig Dispense Refill  . ranitidine (ZANTAC) 150 MG capsule  Take 1 capsule (150 mg total) by mouth 2 (two) times daily. 60 capsule 0   No current facility-administered medications on file prior to visit.    BP 110/60 mmHg  Pulse 100  Temp(Src) 97.9 F (36.6 C) (Oral)  Resp 16  Ht 5' (1.524 m)  Wt 164 lb 3.2 oz (74.481 kg)  BMI 32.07 kg/m2  SpO2 98%  LMP 09/08/2014      Objective:   Physical Exam  General  Mental Status - Alert. General Appearance - Well groomed. Not in acute distress.  Skin Rashes- No Rashes.  HEENT Head- Normal. Ear Auditory Canal - Left- Normal. Right - Normal.Tympanic Membrane- Left- Normal. Right- Normal. Eye Sclera/Conjunctiva- Left- Normal. Right- Normal. Nose & Sinuses Nasal Mucosa- Left-  Faint boggy + Congested. Right-  Faint  boggy + Congested. Frontal rt maxillary  Pressure/tenderness. Mouth & Throat Lips: Upper Lip- Normal: no dryness, cracking, pallor, cyanosis, or vesicular eruption. Lower Lip-Normal: no dryness, cracking, pallor, cyanosis or vesicular eruption. Buccal Mucosa- Bilateral- No Aphthous ulcers. Oropharynx- No Discharge or Erythema. Tonsils: Characteristics- Bilateral- No Erythema or Congestion. Size/Enlargement- Bilateral- No enlargement. Discharge- bilateral-None.  Neck Neck- Supple. No Masses.   Chest and Lung Exam Auscultation: Breath Sounds:- even and unlabored, but bilateral upper lobe rhonchi.  Cardiovascular Auscultation:Rythm- Regular, rate and rhythm. Murmurs & Other Heart Sounds:Ausculatation of the heart reveal- No Murmurs.  Lymphatic Head & Neck General Head & Neck Lymphatics: Bilateral: Description- No Localized lymphadenopathy.       Assessment & Plan:  With your moderate high fever and productive cough will treat for bronchitis but also consider possibility of pneumonia. Rx rocephin IM today and azithromycin. Hycodan for cough. Since you have some wheezing recently as well rx albuterol if needed.  The above antibiotics should help for sinusitis as  well.  Rapid strep appears negative.  Follow up 7 days or as

## 2014-09-16 NOTE — Addendum Note (Signed)
Addended by: Neldon Labella on: 09/16/2014 08:38 AM   Modules accepted: Orders

## 2014-09-16 NOTE — Progress Notes (Signed)
Pre visit review using our clinic review tool, if applicable. No additional management support is needed unless otherwise documented below in the visit note. 

## 2014-09-16 NOTE — Patient Instructions (Addendum)
With your moderate high fever and productive cough will treat for bronchitis but also consider possibility of pneumonia. Rx rocephin IM today and azithromycin. Hycodan for cough. Since you have some wheezing recently as well rx albuterol if needed.  The above antibiotics should help for sinusitis as well.  Rapid strep appears negative.  Follow up 7 days or as needed

## 2014-09-16 NOTE — Addendum Note (Signed)
Addended by: Neldon Labella on: 09/16/2014 11:08 AM   Modules accepted: Orders

## 2014-11-19 ENCOUNTER — Ambulatory Visit (INDEPENDENT_AMBULATORY_CARE_PROVIDER_SITE_OTHER): Payer: 59 | Admitting: Family

## 2014-11-19 ENCOUNTER — Encounter: Payer: Self-pay | Admitting: Family

## 2014-11-19 VITALS — BP 122/67 | HR 82 | Temp 98.3°F | Resp 16 | Ht 60.0 in | Wt 161.2 lb

## 2014-11-19 DIAGNOSIS — J0100 Acute maxillary sinusitis, unspecified: Secondary | ICD-10-CM

## 2014-11-19 MED ORDER — AMOXICILLIN-POT CLAVULANATE 875-125 MG PO TABS: 1.0000 | ORAL_TABLET | Freq: Two times a day (BID) | ORAL | Status: DC

## 2014-11-19 NOTE — Progress Notes (Signed)
   Subjective:    Patient ID: Wendy Nunez, female    DOB: 11/01/1985, 29 y.o.   MRN: [id number]  HPI  Wendy Nunez is a 29 yr old female who presents today with chief complaint of cough.  Cough has been present x 1.5 weeks.  Cough is dry.  [facility] was advised to take delsym/sudafed by her GYN. Not helping.  Was give given zofran odt which helped.  Reports that she is not getting better.  Has associated sinus pain, drainage. Sinus drainage is yellow in color.   Pt is [redacted] weeks pregnant and is experiencing a lot of nausea.    Review of Systems See HPI  Past Medical History  Diagnosis Date  . Hepatitis C carrier   . Seasonal allergies     Social History   Social History  . Marital Status: Single    Spouse Name: N/A  . Number of Children: N/A  . Years of Education: N/A   Occupational History  . Not on file.   Social History Main Topics  . Smoking status: Never Smoker   . Smokeless tobacco: Never Used  . Alcohol Use: Yes  . Drug Use: No  . Sexual Activity: Not on file   Other Topics Concern  . 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    Past Surgical History  Procedure Laterality Date  . Cesarean section      Family History  Problem Relation Age of Onset  . Arthritis Other   . Cancer Other     bladder  . Hyperlipidemia Other   . Hypertension Other   . Stroke Other     No Known Allergies  No current outpatient prescriptions on file prior to visit.   No current facility-administered medications on file prior to visit.    BP 122/67 mmHg  Pulse 82  Temp(Src) 98.3 F (36.8 C) (Oral)  Resp 16  Ht 5' (1.524 m)  Wt 161 lb 3.2 oz (73.12 kg)  BMI 31.48 kg/m2  SpO2 99%  LMP 09/08/2014       Objective:   Physical Exam  Constitutional: She appears well-developed and well-nourished.  HENT:  Right Ear: Tympanic membrane and ear canal normal.  Nose: Right  sinus exhibits maxillary sinus tenderness and frontal sinus tenderness. Left sinus exhibits maxillary sinus tenderness and frontal sinus tenderness.  Mouth/Throat: No oropharyngeal exudate, posterior oropharyngeal edema or posterior oropharyngeal erythema.  Cardiovascular: Normal rate, regular rhythm and normal heart sounds.   No murmur heard. Pulmonary/Chest: Effort normal and breath sounds normal. No respiratory distress. She has no wheezes.  Psychiatric: She has a normal mood and affect. Her behavior is normal. Judgment and thought content normal.          Assessment & Plan:

## 2014-11-19 NOTE — Assessment & Plan Note (Signed)
New. Rx with augmentin, follow up if symptoms worsen or do not improve.

## 2014-11-19 NOTE — Progress Notes (Signed)
Pre visit review using our clinic review tool, if applicable. No additional management support is needed unless otherwise documented below in the visit note. 

## 2014-11-19 NOTE — Patient Instructions (Signed)
Continue delsym, sudafed as needed. Start augmentin for sinusitis. Call if symptoms worsen, or if not improved in 3 days.

## 2014-11-23 ENCOUNTER — Inpatient Hospital Stay (HOSPITAL_COMMUNITY): Payer: 59

## 2014-11-23 ENCOUNTER — Inpatient Hospital Stay (HOSPITAL_COMMUNITY)
Admission: AD | Admit: 2014-11-23 | Discharge: 2014-11-23 | Disposition: A | Discharge status: Home / Self Care | Payer: 59 | Source: Ambulatory Visit | Attending: Obstetrics and Gynecology | Admitting: Obstetrics and Gynecology

## 2014-11-23 ENCOUNTER — Telehealth: Payer: Self-pay

## 2014-11-23 ENCOUNTER — Encounter (HOSPITAL_COMMUNITY): Payer: Self-pay | Admitting: *Deleted

## 2014-11-23 DIAGNOSIS — O210 Mild hyperemesis gravidarum: Secondary | ICD-10-CM | POA: Insufficient documentation

## 2014-11-23 DIAGNOSIS — J069 Acute upper respiratory infection, unspecified: Secondary | ICD-10-CM | POA: Insufficient documentation

## 2014-11-23 DIAGNOSIS — O99511 Diseases of the respiratory system complicating pregnancy, first trimester: Secondary | ICD-10-CM | POA: Diagnosis not present

## 2014-11-23 DIAGNOSIS — Z3A10 10 weeks gestation of pregnancy: Secondary | ICD-10-CM | POA: Diagnosis not present

## 2014-11-23 DIAGNOSIS — K5900 Constipation, unspecified: Secondary | ICD-10-CM

## 2014-11-23 DIAGNOSIS — R05 Cough: Secondary | ICD-10-CM | POA: Insufficient documentation

## 2014-11-23 HISTORY — DX: Other complications of anesthesia, initial encounter: T88.59XA

## 2014-11-23 HISTORY — DX: Adverse effect of unspecified anesthetic, initial encounter: T41.45XA

## 2014-11-23 LAB — COMPREHENSIVE METABOLIC PANEL
ALBUMIN: 3.5 g/dL (ref 3.5–5.0)
ALK PHOS: 84 U/L (ref 38–126)
ALT: 11 U/L — ABNORMAL LOW (ref 14–54)
ANION GAP: 7 (ref 5–15)
AST: 15 U/L (ref 15–41)
BUN: 7 mg/dL (ref 6–20)
CALCIUM: 8.6 mg/dL — AB (ref 8.9–10.3)
CHLORIDE: 103 mmol/L (ref 101–111)
CO2: 24 mmol/L (ref 22–32)
Creatinine, Ser: 0.59 mg/dL (ref 0.44–1.00)
GFR calc non Af Amer: >60 mL/min (ref >60)
GLUCOSE: 82 mg/dL (ref 65–99)
POTASSIUM: 3.6 mmol/L (ref 3.5–5.1)
SODIUM: 134 mmol/L — AB (ref 135–145)
Total Bilirubin: 0.3 mg/dL (ref 0.3–1.2)
Total Protein: 7.5 g/dL (ref 6.5–8.1)

## 2014-11-23 LAB — CBC
HCT: 37 % (ref 36.0–46.0)
HEMOGLOBIN: 12.6 g/dL (ref 12.0–15.0)
MCH: 29.4 pg (ref 26.0–34.0)
MCHC: 34.1 g/dL (ref 30.0–36.0)
MCV: 86.2 fL (ref 78.0–100.0)
PLATELETS: 272 10*3/uL (ref 150–400)
RBC: 4.29 MIL/uL (ref 3.87–5.11)
RDW: 13.8 % (ref 11.5–15.5)
WBC: 14.4 10*3/uL — ABNORMAL HIGH (ref 4.0–10.5)

## 2014-11-23 LAB — URINALYSIS, ROUTINE W REFLEX MICROSCOPIC
Bilirubin Urine: NEGATIVE
Glucose, UA: NEGATIVE mg/dL
Ketones, ur: NEGATIVE mg/dL
Leukocytes, UA: NEGATIVE
Nitrite: NEGATIVE
PH: 6.5 (ref 5.0–8.0)
Protein, ur: NEGATIVE mg/dL
SPECIFIC GRAVITY, URINE: 1.015 (ref 1.005–1.030)
UROBILINOGEN UA: 1 mg/dL (ref 0.0–1.0)

## 2014-11-23 LAB — URINE MICROSCOPIC-ADD ON

## 2014-11-23 MED ORDER — AZITHROMYCIN 250 MG PO TABS: ORAL_TABLET | ORAL | Status: DC

## 2014-11-23 MED ORDER — HYDROCOD POLST-CPM POLST ER 10-8 MG/5ML PO SUER
5.0000 mL | Freq: Once | ORAL | Status: AC
Start: 2014-11-23 — End: 2014-11-23
  Administered 2014-11-23: 5 mL via ORAL
  Filled 2014-11-23: qty 5

## 2014-11-23 MED ORDER — ONDANSETRON HCL 4 MG/2ML IJ SOLN
4.0000 mg | Freq: Once | INTRAMUSCULAR | Status: AC
  Administered 2014-11-23: 4 mg via INTRAVENOUS
  Filled 2014-11-23: qty 2

## 2014-11-23 MED ORDER — AMOXICILLIN 400 MG/5ML PO SUSR: 500.0000 mg | Freq: Two times a day (BID) | ORAL | Status: DC

## 2014-11-23 MED ORDER — LACTATED RINGERS IV BOLUS (SEPSIS): 1000.0000 mL | Freq: Once | INTRAVENOUS | Status: DC

## 2014-11-23 MED ORDER — HYDROCOD POLST-CPM POLST ER 10-8 MG/5ML PO SUER: 5.0000 mL | Freq: Two times a day (BID) | ORAL | Status: DC

## 2014-11-23 MED ORDER — ONDANSETRON 8 MG PO TBDP: 8.0000 mg | ORAL_TABLET | Freq: Three times a day (TID) | ORAL | Status: DC | PRN

## 2014-11-23 MED ORDER — BENZONATATE 100 MG PO CAPS
200.0000 mg | ORAL_CAPSULE | Freq: Once | ORAL | Status: AC
  Administered 2014-11-23: 200 mg via ORAL
  Filled 2014-11-23: qty 2

## 2014-11-23 MED ORDER — FAMOTIDINE IN NACL 20-0.9 MG/50ML-% IV SOLN
20.0000 mg | Freq: Once | INTRAVENOUS | Status: AC
  Administered 2014-11-23: 20 mg via INTRAVENOUS
  Filled 2014-11-23: qty 50

## 2014-11-23 MED ORDER — PROMETHAZINE HCL 25 MG/ML IJ SOLN
Freq: Once | INTRAVENOUS | Status: AC
  Administered 2014-11-23: 17:00:00 via INTRAVENOUS
  Filled 2014-11-23: qty 1000

## 2014-11-23 NOTE — Telephone Encounter (Signed)
augment making her sick 10 1/[redacted] weeks pregnant she cannot keep it down she is still sick is there anything else she can take. Per patient her OBGYN Doctor  told her to ask for a shot or liquid form of this medication. Please advise patient

## 2014-11-23 NOTE — MAU Provider Note (Signed)
History     CSN: [id number]  Arrival date and time: 11/23/14 1535   None     Chief Complaint  Patient presents with  . Cough  . Emesis   HPI Pt is [redacted]w[redacted]d pregnant G2P0 who presents with cough for 3 weeks. Pt saw PCP and dx with sinusitis and Rx Augmentin- pt has been unable to tolerate augmntin due to nausea and vomiting.  Pt has dry hacking cough.  She has sinus congestion and "junk" when she blows her nose. Pt is not coughing up any sputum.  Pt's throat has been mildly sore. Pt has been taking zofran for the nausea and vomiting which has helped.   Pt had a temperature of 101 today.   Pt denies any spotting or bleeding RN note:  Editor: Job Founds Spurlock-Frizzell, RN (Registered Nurse)     Expand All Collapse All   Had cough for 3 wks. Called OB last week, to find out what she could take. Then saw primary, dx with sinus inf. Ongoing/every day vomiting continues. Had temp 101 this morning. Took some Tylenol. Called office. Not able to keep anything down, was instructed to come here.         Past Medical History  Diagnosis Date  . Hepatitis C carrier   . Seasonal allergies     Past Surgical History  Procedure Laterality Date  . Cesarean section      Family History  Problem Relation Age of Onset  . Arthritis Other   . Cancer Other     bladder  . Hyperlipidemia Other   . Hypertension Other   . Stroke Other     Social History  Substance Use Topics  . Smoking status: Never Smoker   . Smokeless tobacco: Never Used  . Alcohol Use: Yes    Allergies: No Known Allergies  Prescriptions prior to admission  Medication Sig Dispense Refill Last Dose  . folic acid (FOLVITE) 1 MG tablet Take 1 mg by mouth daily.   Taking  . omeprazole (PRILOSEC) 20 MG capsule Take 20 mg by mouth daily.   Taking  . ondansetron (ZOFRAN-ODT) 8 MG disintegrating tablet Take 8 mg by mouth 2 (two) times daily.   Taking  . Prenatal Multivit-Min-Fe-FA (PRENATAL VITAMINS PO) Take 1  tablet by mouth daily.   Taking  . promethazine (PHENERGAN) 25 MG tablet Take 25 mg by mouth at bedtime as needed.  2 Taking  . [DISCONTINUED] amoxicillin-clavulanate (AUGMENTIN) 875-125 MG tablet Take 1 tablet by mouth 2 (two) times daily. 20 tablet 0     Review of Systems  Constitutional: Positive for fever and malaise/fatigue. Negative for chills.  Gastrointestinal: Positive for nausea, vomiting and constipation. Negative for diarrhea.       Abd pain with repetative coughing  Genitourinary: Negative for dysuria and urgency.       Dark urine- color of "whiskey"   Physical Exam   Blood pressure 112/84, pulse 108, temperature 98.7 F (37.1 C), temperature source Oral, resp. rate 20, weight 161 lb 9.6 oz (73.301 kg), last menstrual period 09/08/2014, SpO2 100 %.  Physical Exam  Nursing note and vitals reviewed. Constitutional: She is oriented to person, place, and time. She appears well-developed and well-nourished.  HENT:  Head: Normocephalic.  Mouth/Throat: Oropharynx is clear and moist. No oropharyngeal exudate.  slightly reddened pharynx but no edema noted  Eyes: Conjunctivae are normal. Pupils are equal, round, and reactive to light.  Neck: Normal range of motion. Neck supple.  Cardiovascular: Normal rate,  regular rhythm and normal heart sounds.   Respiratory: Effort normal and breath sounds normal. No respiratory distress. She has no wheezes. She has no rales.  GI: Soft. She exhibits no distension. There is no tenderness. There is no rebound.  Musculoskeletal: Normal range of motion.  Neurological: She is alert and oriented to person, place, and time.  Skin: Skin is warm and dry.  Psychiatric: She has a normal mood and affect.    MAU Course  Procedures  Discussed with Dr. Henderson Cloud: D5LR with phenergan  IV Cbc cmet Results for orders placed or performed during the hospital encounter of 11/23/14 (from the past 24 hour(s))  Urinalysis, Routine w reflex microscopic (not  at [facility])     Status: Abnormal   Collection Time: 11/23/14  4:00 PM  Result Value Ref Range   Color, Urine YELLOW YELLOW   APPearance CLEAR CLEAR   Specific Gravity, Urine 1.015 1.005 - 1.030   pH 6.5 5.0 - 8.0   Glucose, UA NEGATIVE NEGATIVE mg/dL   Hgb urine dipstick TRACE (A) NEGATIVE   Bilirubin Urine NEGATIVE NEGATIVE   Ketones, ur NEGATIVE NEGATIVE mg/dL   Protein, ur NEGATIVE NEGATIVE mg/dL   Urobilinogen, UA 1.0 0.0 - 1.0 mg/dL   Nitrite NEGATIVE NEGATIVE   Leukocytes, UA NEGATIVE NEGATIVE  Urine microscopic-add on     Status: Abnormal   Collection Time: 11/23/14  4:00 PM  Result Value Ref Range   Squamous Epithelial / LPF FEW (A) RARE   Crystals CA OXALATE CRYSTALS (A) NEGATIVE  CBC     Status: Abnormal   Collection Time: 11/23/14  4:36 PM  Result Value Ref Range   WBC 14.4 (H) 4.0 - 10.5 K/uL   RBC 4.29 3.87 - 5.11 MIL/uL   Hemoglobin 12.6 12.0 - 15.0 g/dL   HCT 40.9 81.1 - 91.4 %   MCV 86.2 78.0 - 100.0 fL   MCH 29.4 26.0 - 34.0 pg   MCHC 34.1 30.0 - 36.0 g/dL   RDW 78.2 95.6 - 21.3 %   Platelets 272 150 - 400 K/uL  Comprehensive metabolic panel     Status: Abnormal   Collection Time: 11/23/14  4:36 PM  Result Value Ref Range   Sodium 134 (L) 135 - 145 mmol/L   Potassium 3.6 3.5 - 5.1 mmol/L   Chloride 103 101 - 111 mmol/L   CO2 24 22 - 32 mmol/L   Glucose, Bld 82 65 - 99 mg/dL   BUN 7 6 - 20 mg/dL   Creatinine, Ser 0.86 0.44 - 1.00 mg/dL   Calcium 8.6 (L) 8.9 - 10.3 mg/dL   Total Protein 7.5 6.5 - 8.1 g/dL   Albumin 3.5 3.5 - 5.0 g/dL   AST 15 15 - 41 U/L   ALT 11 (L) 14 - 54 U/L   Alkaline Phosphatase 84 38 - 126 U/L   Total Bilirubin 0.3 0.3 - 1.2 mg/dL   GFR calc non Af Amer >60 >60 mL/min   GFR calc Af Amer >60 >60 mL/min   Anion gap 7 5 - 15   Flu swab- in process CXR-neg Cough persists- Tessalon perles given- cough persists- consulted pharmacy Tussionex ordered which helped coughing Dg Chest 2 View  11/23/2014  CLINICAL DATA:  Three-week  history of cough. Patient is pregnant. Patient was double shielded. EXAM: CHEST  2 VIEW COMPARISON:  09/16/2014 FINDINGS: The heart size and mediastinal contours are within normal limits. Both lungs are clear. The visualized skeletal structures are unremarkable. IMPRESSION: Normal  chest x-ray. Electronically Signed   By: Rudie MeyerP.  Gallerani M.D.   On: 11/23/2014 17:10   Assessment and Plan  Upper Respiratory Infection- Rx Z-Pack Cough- Tussionex Rx #2 oz Nausea and vomiting in pregnancy- Rx Zofran and Phenergan F/u with OB appointment- sooner if sx persist  LINEBERRY,SUSAN 11/23/2014, 3:59 PM

## 2014-11-23 NOTE — Discharge Instructions (Signed)

## 2014-11-23 NOTE — Telephone Encounter (Signed)
D/c augmentin, start amoxicillin suspension bid.  Let me know if she cannot tolerate.

## 2014-11-23 NOTE — MAU Note (Signed)
Had cough for 3 wks.  Called OB last week, to find out what she could take.  Then saw primary, dx with sinus inf.  Ongoing/every day vomiting continues.   Had temp 101 this morning.  Took some Tylenol.  Called office. Not able to keep anything down, was instructed to come here.

## 2014-11-23 NOTE — Telephone Encounter (Signed)
Please advise 

## 2014-11-23 NOTE — Addendum Note (Signed)
Addended by: Sandford Craze'SULLIVAN, Maylen Waltermire on: 11/23/2014 03:45 PM   Modules accepted: Orders, Medications

## 2014-11-23 NOTE — Telephone Encounter (Signed)
Notified pt. 

## 2014-11-24 LAB — RESPIRATORY VIRUS PANEL
Adenovirus: NEGATIVE
Influenza A: NEGATIVE
Influenza B: NEGATIVE
METAPNEUMOVIRUS: NEGATIVE
PARAINFLUENZA 1 A: NEGATIVE
PARAINFLUENZA 2 A: NEGATIVE
Parainfluenza 3: NEGATIVE
RESPIRATORY SYNCYTIAL VIRUS A: NEGATIVE
RESPIRATORY SYNCYTIAL VIRUS B: NEGATIVE
RHINOVIRUS: POSITIVE — AB

## 2014-12-03 LAB — OB RESULTS CONSOLE ANTIBODY SCREEN: ANTIBODY SCREEN: NEGATIVE

## 2014-12-03 LAB — OB RESULTS CONSOLE ABO/RH: RH TYPE: POSITIVE

## 2014-12-03 LAB — OB RESULTS CONSOLE HEPATITIS B SURFACE ANTIGEN: HEP B S AG: NEGATIVE

## 2014-12-03 LAB — OB RESULTS CONSOLE GC/CHLAMYDIA
Chlamydia: NEGATIVE
Gonorrhea: NEGATIVE

## 2014-12-03 LAB — OB RESULTS CONSOLE RUBELLA ANTIBODY, IGM: Rubella: NON-IMMUNE/NOT IMMUNE

## 2014-12-03 LAB — OB RESULTS CONSOLE RPR: RPR: NONREACTIVE

## 2014-12-03 LAB — OB RESULTS CONSOLE HIV ANTIBODY (ROUTINE TESTING): HIV: NONREACTIVE

## 2014-12-24 ENCOUNTER — Encounter (HOSPITAL_COMMUNITY): Payer: Self-pay | Admitting: *Deleted

## 2014-12-24 ENCOUNTER — Inpatient Hospital Stay (HOSPITAL_COMMUNITY)
Admission: AD | Admit: 2014-12-24 | Discharge: 2014-12-24 | Disposition: A | Discharge status: Home / Self Care | Payer: 59 | Source: Ambulatory Visit | Attending: Obstetrics and Gynecology | Admitting: Obstetrics and Gynecology

## 2014-12-24 DIAGNOSIS — O210 Mild hyperemesis gravidarum: Secondary | ICD-10-CM | POA: Insufficient documentation

## 2014-12-24 DIAGNOSIS — O211 Hyperemesis gravidarum with metabolic disturbance: Secondary | ICD-10-CM | POA: Diagnosis not present

## 2014-12-24 DIAGNOSIS — Z3A22 22 weeks gestation of pregnancy: Secondary | ICD-10-CM | POA: Diagnosis not present

## 2014-12-24 HISTORY — DX: Bronchitis, not specified as acute or chronic: J40

## 2014-12-24 LAB — URINE MICROSCOPIC-ADD ON: Bacteria, UA: NONE SEEN

## 2014-12-24 LAB — URINALYSIS, ROUTINE W REFLEX MICROSCOPIC
BILIRUBIN URINE: NEGATIVE
Glucose, UA: 500 mg/dL — AB
Ketones, ur: NEGATIVE mg/dL
Leukocytes, UA: NEGATIVE
NITRITE: NEGATIVE
PROTEIN: NEGATIVE mg/dL
SPECIFIC GRAVITY, URINE: 1.02 (ref 1.005–1.030)
pH: 6 (ref 5.0–8.0)

## 2014-12-24 MED ORDER — FAMOTIDINE IN NACL 20-0.9 MG/50ML-% IV SOLN
20.0000 mg | Freq: Once | INTRAVENOUS | Status: AC
  Administered 2014-12-24: 20 mg via INTRAVENOUS
  Filled 2014-12-24: qty 50

## 2014-12-24 MED ORDER — PROMETHAZINE HCL 25 MG/ML IJ SOLN
25.0000 mg | Freq: Once | INTRAMUSCULAR | Status: AC
  Administered 2014-12-24: 25 mg via INTRAVENOUS
  Filled 2014-12-24: qty 1

## 2014-12-24 MED ORDER — DIPHENHYDRAMINE HCL 50 MG/ML IJ SOLN
25.0000 mg | Freq: Once | INTRAMUSCULAR | Status: AC
  Administered 2014-12-24: 25 mg via INTRAVENOUS
  Filled 2014-12-24: qty 1

## 2014-12-24 MED ORDER — LACTATED RINGERS IV BOLUS (SEPSIS)
1000.0000 mL | Freq: Once | INTRAVENOUS | Status: AC
  Administered 2014-12-24: 1000 mL via INTRAVENOUS

## 2014-12-24 NOTE — MAU Note (Signed)
Sent from office for IV fluids and IV meds.

## 2014-12-24 NOTE — Discharge Instructions (Signed)
Hyperemesis Gravidarum  Hyperemesis gravidarum is a severe form of nausea and vomiting that happens during pregnancy. Hyperemesis is worse than morning sickness. It may cause you to have nausea or vomiting all day for many days. It may keep you from eating and drinking enough food and liquids. Hyperemesis usually occurs during the first half (the first 20 weeks) of pregnancy. It often goes away once a woman is in her second half of pregnancy. However, sometimes hyperemesis continues through an entire pregnancy.   CAUSES   The cause of this condition is not completely known but is thought to be related to changes in the body's hormones when pregnant. It could be from the high level of the pregnancy hormone or an increase in estrogen in the body.   SIGNS AND SYMPTOMS    Severe nausea and vomiting.   Nausea that does not go away.   Vomiting that does not allow you to keep any food down.   Weight loss and body fluid loss (dehydration).   Having no desire to eat or not liking food you have previously enjoyed.  DIAGNOSIS   Your health care provider will do a physical exam and ask you about your symptoms. He or she may also order blood tests and urine tests to make sure something else is not causing the problem.   TREATMENT   You may only need medicine to control the problem. If medicines do not control the nausea and vomiting, you will be treated in the hospital to prevent dehydration, increased acid in the blood (acidosis), weight loss, and changes in the electrolytes in your body that may harm the unborn baby (fetus). You may need IV fluids.   HOME CARE INSTRUCTIONS    Only take over-the-counter or prescription medicines as directed by your health care provider.   Try eating a couple of dry crackers or toast in the morning before getting out of bed.   Avoid foods and smells that upset your stomach.   Avoid fatty and spicy foods.   Eat 5-6 small meals a day.   Do not drink when eating meals. Drink between  meals.   For snacks, eat high-protein foods, such as cheese.   Eat or suck on things that have ginger in them. Ginger helps nausea.   Avoid food preparation. The smell of food can spoil your appetite.   Avoid iron pills and iron in your multivitamins until after 3-4 months of being pregnant. However, consult with your health care provider before stopping any prescribed iron pills.  SEEK MEDICAL CARE IF:    Your abdominal pain increases.   You have a severe headache.   You have vision problems.   You are losing weight.  SEEK IMMEDIATE MEDICAL CARE IF:    You are unable to keep fluids down.   You vomit blood.   You have constant nausea and vomiting.   You have excessive weakness.   You have extreme thirst.   You have dizziness or fainting.   You have a fever or persistent symptoms for more than 2-3 days.   You have a fever and your symptoms suddenly get worse.  MAKE SURE YOU:    Understand these instructions.   Will watch your condition.   Will get help right away if you are not doing well or get worse.     This information is not intended to replace advice given to you by your health care provider. Make sure you discuss any questions you have with   your health care provider.     Document Released: 01/08/2005 Document Revised: 10/29/2012 Document Reviewed: 08/20/2012  Elsevier Interactive Patient Education 2016 [employer]

## 2014-12-24 NOTE — MAU Provider Note (Signed)
Chief Complaint: Hyperemesis Gravidarum   None     SUBJECTIVE HPI: Wendy Nunez is a 29 y.o. G2P0 at [redacted]w[redacted]d by LMP who presents to maternity admissions sent from the office for hyperemesis. She reports n/v throughout pregnancy but the last few days it has been worse and she has been unable to keep anything down.  She took Phenergan last night and Zofran today as prescribed and it is not helping.  She was seen in the office and has new Rx for Reglan and Prilosec today but has not started these medications.  She denies abdominal pain  vaginal bleeding, vaginal itching/burning, urinary symptoms, h/a, dizziness, n/v, or fever/chills.     HPI  Past Medical History  Diagnosis Date  . Hepatitis C carrier   . Seasonal allergies   . Complication of anesthesia     temp. dropped  . Bronchitis    Past Surgical History  Procedure Laterality Date  . Cesarean section    . Wisdom tooth extraction     Social History   Social History  . Marital Status: Single    Spouse Name: N/A  . Number of Children: N/A  . Years of Education: N/A   Occupational History  . Not on file.   Social History Main Topics  . Smoking status: Never Smoker   . Smokeless tobacco: Never Used  . Alcohol Use: Yes  . Drug Use: No  . Sexual Activity: Not on file   Other Topics Concern  . 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   No current facility-administered medications on file prior to encounter.   Current Outpatient Prescriptions on File Prior to Encounter  Medication Sig Dispense Refill  . acetaminophen (TYLENOL) 325 MG tablet Take 325 mg by mouth every 6 (six) hours as needed for fever.    . folic acid (FOLVITE) 1 MG tablet Take 1 mg by mouth daily.    . ondansetron (ZOFRAN ODT) 8 MG disintegrating tablet Take 1 tablet (8 mg total) by mouth every 8 (eight) hours as needed for nausea or vomiting. 20 tablet 0  . Prenatal  Vit-Fe Fumarate-FA (PRENATAL MULTIVITAMIN) TABS tablet Take 1 tablet by mouth daily at 12 noon.    . promethazine (PHENERGAN) 25 MG tablet Take 25 mg by mouth at bedtime as needed.  2  . azithromycin (ZITHROMAX Z-PAK) 250 MG tablet 2 tablets Day 1 then 1 tablet daily (Patient not taking: Reported on 12/24/2014) 6 each 0  . chlorpheniramine-HYDROcodone (TUSSIONEX PENNKINETIC ER) 10-8 MG/5ML SUER Take 5 mLs by mouth 2 (two) times daily. (Patient not taking: Reported on 12/24/2014) 60 mL 0  . omeprazole (PRILOSEC) 20 MG capsule Take 20 mg by mouth daily.     No Known Allergies  ROS:  Review of Systems  Constitutional: Negative for fever, chills and fatigue.  HENT: Negative for sinus pressure.   Eyes: Negative for photophobia.  Respiratory: Negative for shortness of breath.   Cardiovascular: Negative for chest pain.  Gastrointestinal: Negative for nausea, vomiting, diarrhea and constipation.  Genitourinary: Negative for dysuria, frequency, flank pain, vaginal bleeding, vaginal discharge, difficulty urinating, vaginal pain and pelvic pain.  Musculoskeletal: Negative for neck pain.  Neurological: Negative for dizziness, weakness and headaches.  Psychiatric/Behavioral: Negative.      I have reviewed patient's Past Medical Hx, Surgical Hx, Family Hx, Social Hx, medications and allergies.  Physical Exam   Patient Vitals for the past 24 hrs:  BP Temp Temp src Pulse Resp Weight  12/24/14 1640 119/71 mmHg - - 78 - -  12/24/14 1528 162/82 mmHg 98.1 F (36.7 C) Oral 89 18 162 lb 6.4 oz (73.664 kg)   Constitutional: Well-developed, well-nourished female in no acute distress.  Cardiovascular: normal rate Respiratory: normal effort GI: Abd soft, non-tender. Pos BS x 4 MS: Extremities nontender, no edema, normal ROM Neurologic: Alert and oriented x 4.  GU: Neg CVAT.   FHT 153 by doppler  LAB RESULTS Results for orders placed or performed during the hospital encounter of 12/24/14 (from the  past 24 hour(s))  Urinalysis, Routine w reflex microscopic (not at [facility])     Status: Abnormal   Collection Time: 12/24/14  3:15 PM  Result Value Ref Range   Color, Urine YELLOW YELLOW   APPearance HAZY (A) CLEAR   Specific Gravity, Urine 1.020 1.005 - 1.030   pH 6.0 5.0 - 8.0   Glucose, UA 500 (A) NEGATIVE mg/dL   Hgb urine dipstick SMALL (A) NEGATIVE   Bilirubin Urine NEGATIVE NEGATIVE   Ketones, ur NEGATIVE NEGATIVE mg/dL   Protein, ur NEGATIVE NEGATIVE mg/dL   Nitrite NEGATIVE NEGATIVE   Leukocytes, UA NEGATIVE NEGATIVE  Urine microscopic-add on     Status: Abnormal   Collection Time: 12/24/14  3:15 PM  Result Value Ref Range   Squamous Epithelial / LPF 0-5 (A) NONE SEEN   WBC, UA 0-5 0 - 5 WBC/hpf   RBC / HPF 0-5 0 - 5 RBC/hpf   Bacteria, UA NONE SEEN NONE SEEN   Crystals CA OXALATE CRYSTALS (A) NEGATIVE   Urine-Other MUCOUS PRESENT        IMAGING No results found.  MAU Management/MDM: Ordered labs and reviewed results.  Consult Dr Dareen PianoAnderson.  Treatments in MAU included LR x 1000 ml, Phenergan 25 mg IV, and Pepcid 20 mg IV.  Pt reported feeling jittery and like her skin was crawling after IV Phenergan.  Benadryl 25 mg IV given which improved symptoms.  Pt to pick up new Rx for Prilosec and Reglan and start taking these. F/U in office as scheduled. Pt stable at time of discharge.  ASSESSMENT 1. Hyperemesis gravidarum before end of [redacted] week gestation, dehydration     PLAN Discharge home   Follow-up Information    Follow up with Levi AlandANDERSON,MARK E, MD.   Specialty:  Obstetrics and Gynecology   Why:  As scheduled, Return to MAU as needed for emergencies   Contact information:   [address] [place] [id number] [phone]       Sharen CounterLisa Leftwich-Kirby Certified Nurse-Midwife 12/24/2014  6:53 PM

## 2015-01-28 DIAGNOSIS — Z36 Encounter for antenatal screening of mother: Secondary | ICD-10-CM | POA: Diagnosis not present

## 2015-01-28 MED FILL — AMOX-CLAV 875-125 MG TABLET: 875-125 | 7 days supply | Qty: 14 | Fill #0

## 2015-02-21 MED FILL — ONDANSETRON ODT 8 MG TABLET: 8 | 10 days supply | Qty: 30 | Fill #3

## 2015-02-21 MED FILL — METOCLOPRAMIDE 10 MG TABLET: 10 | 20 days supply | Qty: 60 | Fill #1

## 2015-02-21 MED FILL — PROMETHAZINE 25 MG TABLET: 25 | 10 days supply | Qty: 60 | Fill #1

## 2015-03-23 DIAGNOSIS — Z3480 Encounter for supervision of other normal pregnancy, unspecified trimester: Secondary | ICD-10-CM | POA: Diagnosis not present

## 2015-03-23 DIAGNOSIS — Z23 Encounter for immunization: Secondary | ICD-10-CM | POA: Diagnosis not present

## 2015-04-08 MED FILL — ONDANSETRON ODT 8 MG TABLET: 8 | 10 days supply | Qty: 30 | Fill #4

## 2015-04-14 ENCOUNTER — Inpatient Hospital Stay (HOSPITAL_COMMUNITY)
Admission: AD | Admit: 2015-04-14 | Discharge: 2015-04-14 | Disposition: A | Discharge status: Home / Self Care | Payer: 59 | Source: Ambulatory Visit | Attending: Obstetrics and Gynecology | Admitting: Obstetrics and Gynecology

## 2015-04-14 ENCOUNTER — Encounter (HOSPITAL_COMMUNITY): Payer: Self-pay | Admitting: *Deleted

## 2015-04-14 DIAGNOSIS — Z888 Allergy status to other drugs, medicaments and biological substances status: Secondary | ICD-10-CM | POA: Diagnosis not present

## 2015-04-14 DIAGNOSIS — R600 Localized edema: Secondary | ICD-10-CM | POA: Diagnosis not present

## 2015-04-14 DIAGNOSIS — O1203 Gestational edema, third trimester: Secondary | ICD-10-CM | POA: Diagnosis not present

## 2015-04-14 DIAGNOSIS — O163 Unspecified maternal hypertension, third trimester: Secondary | ICD-10-CM | POA: Insufficient documentation

## 2015-04-14 DIAGNOSIS — R51 Headache: Secondary | ICD-10-CM | POA: Diagnosis not present

## 2015-04-14 DIAGNOSIS — IMO0001 Reserved for inherently not codable concepts without codable children: Secondary | ICD-10-CM

## 2015-04-14 DIAGNOSIS — O9989 Other specified diseases and conditions complicating pregnancy, childbirth and the puerperium: Secondary | ICD-10-CM | POA: Diagnosis not present

## 2015-04-14 DIAGNOSIS — R030 Elevated blood-pressure reading, without diagnosis of hypertension: Secondary | ICD-10-CM

## 2015-04-14 DIAGNOSIS — Z3A31 31 weeks gestation of pregnancy: Secondary | ICD-10-CM | POA: Insufficient documentation

## 2015-04-14 DIAGNOSIS — I10 Essential (primary) hypertension: Secondary | ICD-10-CM | POA: Diagnosis present

## 2015-04-14 DIAGNOSIS — R519 Headache, unspecified: Secondary | ICD-10-CM

## 2015-04-14 LAB — CBC WITH DIFFERENTIAL/PLATELET
BASOS ABS: 0 10*3/uL (ref 0.0–0.1)
BASOS PCT: 0 %
Eosinophils Absolute: 0.2 10*3/uL (ref 0.0–0.7)
Eosinophils Relative: 1 %
HEMATOCRIT: 36.1 % (ref 36.0–46.0)
HEMOGLOBIN: 11.7 g/dL — AB (ref 12.0–15.0)
LYMPHS PCT: 12 %
Lymphs Abs: 1.7 10*3/uL (ref 0.7–4.0)
MCH: 27.4 pg (ref 26.0–34.0)
MCHC: 32.4 g/dL (ref 30.0–36.0)
MCV: 84.5 fL (ref 78.0–100.0)
MONO ABS: 0.7 10*3/uL (ref 0.1–1.0)
Monocytes Relative: 5 %
NEUTROS ABS: 11.4 10*3/uL — AB (ref 1.7–7.7)
NEUTROS PCT: 82 %
Platelets: 307 10*3/uL (ref 150–400)
RBC: 4.27 MIL/uL (ref 3.87–5.11)
RDW: 14.2 % (ref 11.5–15.5)
WBC: 13.9 10*3/uL — AB (ref 4.0–10.5)

## 2015-04-14 LAB — COMPREHENSIVE METABOLIC PANEL
ALBUMIN: 3.1 g/dL — AB (ref 3.5–5.0)
ALK PHOS: 110 U/L (ref 38–126)
ALT: 11 U/L — AB (ref 14–54)
AST: 20 U/L (ref 15–41)
Anion gap: 7 (ref 5–15)
BILIRUBIN TOTAL: 0.6 mg/dL (ref 0.3–1.2)
BUN: 9 mg/dL (ref 6–20)
CO2: 24 mmol/L (ref 22–32)
CREATININE: 0.68 mg/dL (ref 0.44–1.00)
Calcium: 9 mg/dL (ref 8.9–10.3)
Chloride: 104 mmol/L (ref 101–111)
GFR calc Af Amer: >60 mL/min (ref >60)
GLUCOSE: 68 mg/dL (ref 65–99)
POTASSIUM: 4.1 mmol/L (ref 3.5–5.1)
Sodium: 135 mmol/L (ref 135–145)
TOTAL PROTEIN: 7.5 g/dL (ref 6.5–8.1)

## 2015-04-14 LAB — LACTATE DEHYDROGENASE: LDH: 156 U/L (ref 98–192)

## 2015-04-14 LAB — URINALYSIS, ROUTINE W REFLEX MICROSCOPIC
Bilirubin Urine: NEGATIVE
GLUCOSE, UA: NEGATIVE mg/dL
Hgb urine dipstick: NEGATIVE
KETONES UR: NEGATIVE mg/dL
LEUKOCYTES UA: NEGATIVE
NITRITE: NEGATIVE
PROTEIN: NEGATIVE mg/dL
Specific Gravity, Urine: 1.02 (ref 1.005–1.030)
pH: 6.5 (ref 5.0–8.0)

## 2015-04-14 LAB — URIC ACID: URIC ACID, SERUM: 4.4 mg/dL (ref 2.3–6.6)

## 2015-04-14 LAB — PROTEIN / CREATININE RATIO, URINE
Creatinine, Urine: 65 mg/dL
PROTEIN CREATININE RATIO: 0.18 mg/mg{creat} — AB (ref 0.00–0.15)
Total Protein, Urine: 12 mg/dL

## 2015-04-14 MED ORDER — BUTALBITAL-APAP-CAFFEINE 50-325-40 MG PO TABS
1.0000 | ORAL_TABLET | Freq: Once | ORAL | Status: AC
Start: 2015-04-14 — End: 2015-04-14
  Administered 2015-04-14: 1 via ORAL
  Filled 2015-04-14: qty 1

## 2015-04-14 MED ORDER — BUTALBITAL-APAP-CAFFEINE 50-325-40 MG PO TABS: 1.0000 | ORAL_TABLET | Freq: Four times a day (QID) | ORAL | Status: DC | PRN

## 2015-04-14 MED ORDER — ONDANSETRON 8 MG PO TBDP
8.0000 mg | ORAL_TABLET | Freq: Once | ORAL | Status: AC
  Administered 2015-04-14: 8 mg via ORAL
  Filled 2015-04-14: qty 1

## 2015-04-14 MED ORDER — LACTATED RINGERS IV BOLUS (SEPSIS)
1000.0000 mL | Freq: Once | INTRAVENOUS | Status: AC
Start: 2015-04-14 — End: 2015-04-14
  Administered 2015-04-14: 1000 mL via INTRAVENOUS

## 2015-04-14 NOTE — MAU Provider Note (Signed)
History     CSN: [id number]  Arrival date and time: 04/14/15 1433   First Provider Initiated Contact with Patient 04/14/15 1504      Chief Complaint  Patient presents with  . Hypertension   HPI Ms. Wendy Nunez is a 30 y.o. G2P0 at [redacted]w[redacted]d who presents to MAU today with complaint of headache and elevated blood pressure. The patient states that she woke up with severe headache today. She took Tylenol without relief and pain has not improved. She also states recent changes in vision over the last few days requiring her to wear her glasses more often. She has seen occasional intermittent floaters. She also endorses RUQ abdominal pain and right sided lower chest wall pain and increased peripheral edema. 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 She has had consistent N/V throughout the pregnancy for which she takes Zofran and Zantac, although she missed her dose today. She had a C/S with her first pregnancy and plans repeat.    OB History    Gravida Para Term Preterm AB TAB SAB Ectopic Multiple Living   2 0        1      Past Medical History  Diagnosis Date  . Hepatitis C carrier   . Seasonal allergies   . Complication of anesthesia     temp. dropped  . Bronchitis     Past Surgical History  Procedure Laterality Date  . Cesarean section    . Wisdom tooth extraction      Family History  Problem Relation Age of Onset  . Arthritis Other   . Cancer Other     bladder  . Hyperlipidemia Other   . Hypertension Other   . Stroke Other     Social History  Substance Use Topics  . Smoking status: Never Smoker   . Smokeless tobacco: Never Used  . Alcohol Use: No    Allergies:  Allergies  Allergen Reactions  . Phenergan [Promethazine Hcl] Other (See Comments)    Altered behaviors/mental state when given IV    No prescriptions prior to admission    Review of Systems  Constitutional: Negative for fever and  malaise/fatigue.  Eyes: Positive for blurred vision.       + floaters  Cardiovascular: Positive for leg swelling.  Gastrointestinal: Positive for nausea, vomiting and abdominal pain. Negative for diarrhea and constipation.  Genitourinary:       Neg - vaginal bleeding, discharge, LOF  Neurological: Positive for headaches.   Physical Exam   Blood pressure 133/71, pulse 91, temperature 98.6 F (37 C), resp. rate 18, height  (1.6 m), last menstrual period 09/08/2014.  Physical Exam  Nursing note and vitals reviewed. Constitutional: She is oriented to person, place, and time. She appears well-developed and well-nourished. No distress.  HENT:  Head: Normocephalic and atraumatic.  Cardiovascular: Normal rate.   Respiratory: Effort normal.  GI: Soft. She exhibits no distension and no mass. There is tenderness (RUQ tenderness to palpation, mild). There is no rebound and no guarding.  Musculoskeletal: She exhibits edema (1+ pitting edema bilateral LE).  Neurological: She is alert and oriented to person, place, and time.  Reflex Scores:      Bicep reflexes are 2+ on the right side and 2+ on the left side.      Brachioradialis reflexes are 2+ on the right side and 2+ on the left side.  Patellar reflexes are 3+ on the right side and 3+ on the left side. No clonus  Skin: Skin is warm and dry. No erythema.  Psychiatric: She has a normal mood and affect.    Results for orders placed or performed during the hospital encounter of 04/14/15 (from the past 24 hour(s))  Urinalysis, Routine w reflex microscopic (not at [facility])     Status: None   Collection Time: 04/14/15  2:25 PM  Result Value Ref Range   Color, Urine YELLOW YELLOW   APPearance CLEAR CLEAR   Specific Gravity, Urine 1.020 1.005 - 1.030   pH 6.5 5.0 - 8.0   Glucose, UA NEGATIVE NEGATIVE mg/dL   Hgb urine dipstick NEGATIVE NEGATIVE   Bilirubin Urine NEGATIVE NEGATIVE   Ketones, ur NEGATIVE NEGATIVE mg/dL   Protein, ur  NEGATIVE NEGATIVE mg/dL   Nitrite NEGATIVE NEGATIVE   Leukocytes, UA NEGATIVE NEGATIVE  Protein / creatinine ratio, urine     Status: Abnormal   Collection Time: 04/14/15  2:25 PM  Result Value Ref Range   Creatinine, Urine 65.00 mg/dL   Total Protein, Urine 12 mg/dL   Protein Creatinine Ratio 0.18 (H) 0.00 - 0.15 mg/mg[Cre]  CBC with Differential/Platelet     Status: Abnormal   Collection Time: 04/14/15  3:40 PM  Result Value Ref Range   WBC 13.9 (H) 4.0 - 10.5 K/uL   RBC 4.27 3.87 - 5.11 MIL/uL   Hemoglobin 11.7 (L) 12.0 - 15.0 g/dL   HCT 16.136.1 09.636.0 - 04.546.0 %   MCV 84.5 78.0 - 100.0 fL   MCH 27.4 26.0 - 34.0 pg   MCHC 32.4 30.0 - 36.0 g/dL   RDW 40.914.2 81.111.5 - 91.415.5 %   Platelets 307 150 - 400 K/uL   Neutrophils Relative % 82 %   Neutro Abs 11.4 (H) 1.7 - 7.7 K/uL   Lymphocytes Relative 12 %   Lymphs Abs 1.7 0.7 - 4.0 K/uL   Monocytes Relative 5 %   Monocytes Absolute 0.7 0.1 - 1.0 K/uL   Eosinophils Relative 1 %   Eosinophils Absolute 0.2 0.0 - 0.7 K/uL   Basophils Relative 0 %   Basophils Absolute 0.0 0.0 - 0.1 K/uL  Comprehensive metabolic panel     Status: Abnormal   Collection Time: 04/14/15  3:40 PM  Result Value Ref Range   Sodium 135 135 - 145 mmol/L   Potassium 4.1 3.5 - 5.1 mmol/L   Chloride 104 101 - 111 mmol/L   CO2 24 22 - 32 mmol/L   Glucose, Bld 68 65 - 99 mg/dL   BUN 9 6 - 20 mg/dL   Creatinine, Ser 7.820.68 0.44 - 1.00 mg/dL   Calcium 9.0 8.9 - 95.610.3 mg/dL   Total Protein 7.5 6.5 - 8.1 g/dL   Albumin 3.1 (L) 3.5 - 5.0 g/dL   AST 20 15 - 41 U/L   ALT 11 (L) 14 - 54 U/L   Alkaline Phosphatase 110 38 - 126 U/L   Total Bilirubin 0.6 0.3 - 1.2 mg/dL   GFR calc non Af Amer >60 >60 mL/min   GFR calc Af Amer >60 >60 mL/min   Anion gap 7 5 - 15  Uric acid     Status: None   Collection Time: 04/14/15  3:40 PM  Result Value Ref Range   Uric Acid, Serum 4.4 2.3 - 6.6 mg/dL  Lactate dehydrogenase     Status: None   Collection Time: 04/14/15  3:40 PM  Result Value  Ref Range   LDH 156 98 - 192 U/L   Patient Vitals for the past 24 hrs:  BP Temp Pulse Resp Height  04/14/15 1703 133/71 mmHg - 91 18 -  04/14/15 1655 133/71 mmHg - 91 16 -  04/14/15 1604 124/75 mmHg - 85 - -  04/14/15 1549 122/64 mmHg - 86 - -  04/14/15 1548 125/74 mmHg - 89 - -  04/14/15 1520 125/65 mmHg - 95 - -  04/14/15 1508 141/87 mmHg - 88 - -  04/14/15 1505 137/90 mmHg 98.6 F (37 C) 92 16  (1.6 m)    Fetal Monitoring: Baseline: 140 bpm Variability: moderate Accelerations: 15 x 15 Decelerations: none Contractions: moderate UI, no contractions  MAU Course  Procedures None  MDM Serial BPs CBC, CMP, Uric Acid, LDH, UA, Urine protein/creatinine ratio Zofran ODT given for nausea - no emesis while in MAU Fioricet given for headache - patient reports some improvement in headache IV LR bolus given for uterine irritability BP improved with rest. Mostly normotensive. No severe range pressures.  Discussed with Dr. Dareen Piano. Agrees with plan for discharge at this time with rest. Follow-up as scheduled or sooner PRN.  Assessment and Plan  A: SIUP at [redacted]w[redacted]d Elevated blood pressure in pregnancy, third trimester, mild Headache  LE edema   P: Discharge home Rx for Fioricet given to patient Continued Tylenol PRN for pain Increased rest and PO hydration encouraged Pre-eclampsia precautions discussed. Advised to check blood pressure if headache resumes prior to taking Fioricet.  Patient advised to follow-up with [facility] as scheduled for routine prenatal care or sooner PRN Patient may return to MAU as needed or if her condition were to change or worsen  Marny Lowenstein, PA-C  04/14/2015, 5:26 PM

## 2015-04-14 NOTE — MAU Note (Signed)
Pt states pt woke up with terrible headache and had an elevated blood pressure (138/80).  Pt took tyelanol and napped and headache did not go away.  Pt seeing spots and blurred vision and has had swelling in bilateral ankles.  Pt called office [facility] and they told her to come to MAU.

## 2015-04-14 NOTE — MAU Note (Signed)
Urine sent to lab 

## 2015-04-14 NOTE — Discharge Instructions (Signed)

## 2015-04-15 MED FILL — BUTALB-ACETAMIN-CAFF 50-325: 50-325-40 | 2 days supply | Qty: 20 | Fill #0

## 2015-05-10 MED FILL — ADVAIR 250/50 DISKUS: 250-50 | 30 days supply | Qty: 60 | Fill #0

## 2015-05-16 MED FILL — ONDANSETRON ODT 8 MG TABLET: 8 | 10 days supply | Qty: 30 | Fill #5

## 2015-05-24 DIAGNOSIS — Z36 Encounter for antenatal screening of mother: Secondary | ICD-10-CM | POA: Diagnosis not present

## 2015-05-24 DIAGNOSIS — Z3480 Encounter for supervision of other normal pregnancy, unspecified trimester: Secondary | ICD-10-CM | POA: Diagnosis not present

## 2015-06-02 ENCOUNTER — Encounter (HOSPITAL_COMMUNITY): Payer: Self-pay

## 2015-06-08 NOTE — Anesthesia Preprocedure Evaluation (Addendum)
Anesthesia Evaluation  Patient identified by MRN, date of birth, ID band Patient awake    Reviewed: Allergy & Precautions, NPO status , Patient's Chart, lab work & pertinent test results  History of Anesthesia Complications Negative for: history of anesthetic complications  Airway Mallampati: III  TM Distance: >3 FB Neck ROM: Full    Dental no notable dental hx. (+) Dental Advisory Given   Pulmonary asthma ,    Pulmonary exam normal breath sounds clear to auscultation       Cardiovascular negative cardio ROS Normal cardiovascular exam Rhythm:Regular Rate:Normal     Neuro/Psych PSYCHIATRIC DISORDERS    GI/Hepatic negative GI ROS, Neg liver ROS,   Endo/Other  obesity  Renal/GU negative Renal ROS  negative genitourinary   Musculoskeletal negative musculoskeletal ROS (+)   Abdominal   Peds negative pediatric ROS (+)  Hematology negative hematology ROS (+)   Anesthesia Other Findings   Reproductive/Obstetrics (+) Pregnancy Prior C/S x 1                           Anesthesia Physical Anesthesia Plan  ASA: II  Anesthesia Plan: Spinal   Post-op Pain Management:    Induction:   Airway Management Planned:   Additional Equipment:   Intra-op Plan:   Post-operative Plan:   Informed Consent: I have reviewed the patients History and Physical, chart, labs and discussed the procedure including the risks, benefits and alternatives for the proposed anesthesia with the patient or authorized representative who has indicated his/her understanding and acceptance.   Dental advisory given  Plan Discussed with: CRNA  Anesthesia Plan Comments:        Anesthesia Quick Evaluation

## 2015-06-09 ENCOUNTER — Other Ambulatory Visit: Payer: Self-pay | Admitting: Obstetrics and Gynecology

## 2015-06-09 ENCOUNTER — Encounter (HOSPITAL_COMMUNITY)
Admission: RE | Admit: 2015-06-09 | Discharge: 2015-06-09 | Disposition: A | Discharge status: Home / Self Care | Payer: 59 | Source: Ambulatory Visit | Attending: Obstetrics and Gynecology | Admitting: Obstetrics and Gynecology

## 2015-06-09 DIAGNOSIS — Z6833 Body mass index (BMI) 33.0-33.9, adult: Secondary | ICD-10-CM | POA: Diagnosis not present

## 2015-06-09 DIAGNOSIS — O34211 Maternal care for low transverse scar from previous cesarean delivery: Secondary | ICD-10-CM | POA: Diagnosis not present

## 2015-06-09 DIAGNOSIS — E669 Obesity, unspecified: Secondary | ICD-10-CM | POA: Diagnosis not present

## 2015-06-09 DIAGNOSIS — B182 Chronic viral hepatitis C: Secondary | ICD-10-CM | POA: Diagnosis not present

## 2015-06-09 DIAGNOSIS — O99214 Obesity complicating childbirth: Secondary | ICD-10-CM | POA: Diagnosis not present

## 2015-06-09 DIAGNOSIS — O34219 Maternal care for unspecified type scar from previous cesarean delivery: Secondary | ICD-10-CM | POA: Diagnosis not present

## 2015-06-09 DIAGNOSIS — O9952 Diseases of the respiratory system complicating childbirth: Secondary | ICD-10-CM | POA: Diagnosis not present

## 2015-06-09 DIAGNOSIS — J45909 Unspecified asthma, uncomplicated: Secondary | ICD-10-CM | POA: Diagnosis not present

## 2015-06-09 DIAGNOSIS — O9842 Viral hepatitis complicating childbirth: Secondary | ICD-10-CM | POA: Diagnosis not present

## 2015-06-09 DIAGNOSIS — Z3A39 39 weeks gestation of pregnancy: Secondary | ICD-10-CM | POA: Diagnosis not present

## 2015-06-09 HISTORY — DX: Unspecified asthma, uncomplicated: J45.909

## 2015-06-09 LAB — TYPE AND SCREEN
ABO/RH(D): O POS
Antibody Screen: NEGATIVE

## 2015-06-09 LAB — CBC
HCT: 32 % — ABNORMAL LOW (ref 36.0–46.0)
HEMOGLOBIN: 10.6 g/dL — AB (ref 12.0–15.0)
MCH: 26.1 pg (ref 26.0–34.0)
MCHC: 33.1 g/dL (ref 30.0–36.0)
MCV: 78.8 fL (ref 78.0–100.0)
Platelets: 272 10*3/uL (ref 150–400)
RBC: 4.06 MIL/uL (ref 3.87–5.11)
RDW: 15.9 % — ABNORMAL HIGH (ref 11.5–15.5)
WBC: 11.6 10*3/uL — ABNORMAL HIGH (ref 4.0–10.5)

## 2015-06-09 LAB — ABO/RH: ABO/RH(D): O POS

## 2015-06-09 NOTE — H&P (Signed)
30 y.o.   G2P1001 1094w0d comes in for a repeat cesarean section at term.  Patient has good fetal movement and no bleeding.  Pt has had nausea and vomiting the entire pregnancy.  Past Medical History  Diagnosis Date  . Hepatitis C carrier   . Seasonal allergies   . Bronchitis   . Complication of anesthesia     temp. dropped and O2 dropped  . Asthma     bring inhalers to hospital    Past Surgical History  Procedure Laterality Date  . Cesarean section    . Wisdom tooth extraction      OB History  Gravida Para Term Preterm AB SAB TAB Ectopic Multiple Living  2 1 1       1     # Outcome Date GA Lbr Len/2nd Weight Sex Delivery Anes PTL Lv  2 Current           1 Term 2007 6960w0d  2.977 kg (6 lb 9 oz) F CS-LVertical   Y     Comments: hypertension      Social History   Social History  . Marital Status: Married    Spouse Name: N/A  . Number of Children: N/A  . Years of Education: N/A   Occupational History  . Not on file.   Social History Main Topics  . Smoking status: Never Smoker   . Smokeless tobacco: Never Used  . Alcohol Use: No  . Drug Use: No  . Sexual Activity: Yes   Other Topics Concern  . 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   Phenergan   Prenatal Course: Asthma is being controlled by Advair.  Nausea and vomiting persisted despite prilosec.   Prenatal Transfer Tool  Maternal Diabetes: No Genetic Screening: Normal Maternal Ultrasounds/Referrals: Normal Fetal Ultrasounds or other Referrals:  None Maternal Substance Abuse:  No Significant Maternal Medications:  Meds include: Other: Advair and prilosec Significant Maternal Lab Results: None   There were no vitals filed for this visit.   Lungs/Cor:  NAD Abdomen:  soft, gravid Ex:  no cords, erythema SVE:  NA FHTs:  present  A/P   For repeat cesarean sectionat term.  All risks, benefits and alternatives discussed with  patient and she desires to proceed.  Nihal Doan A

## 2015-06-09 NOTE — Patient Instructions (Signed)
19 Wendy Nunez  06/09/2015   Your procedure is scheduled on:  06/10/2015  Enter through the Main Entrance of [facility] at 0600 AM.  Pick up the phone at the desk and dial [phone].   Call this number if you have problems the morning of surgery: [phone]   Remember:   Do not eat food:After Midnight.  Do not drink clear liquids: After Midnight.  Take these medicines the morning of surgery with A SIP OF WATER: use Advair inhaler in the morning, Bring both inhalers with you in the morning   Do not wear jewelry, make-up or nail polish.  Do not wear lotions, powders, or perfumes. You may wear deodorant.  Do not shave 48 hours prior to surgery.  Do not bring valuables to the hospital.  [facility] is not   responsible for any belongings or valuables brought to the hospital.  Contacts, dentures or bridgework may not be worn into surgery.  Leave suitcase in the car. After surgery it may be brought to your room.  For patients admitted to the hospital, checkout time is 11:00 AM the day of              discharge.   Patients discharged the day of surgery will not be allowed to drive             home.  Name and phone number of your driver: na  Special Instructions:   Shower using CHG 2 nights before surgery and the night before surgery.  If you shower the day of surgery use CHG.  Use special wash - you have one bottle of CHG for all showers.  You should use approximately 1/3 of the bottle for each shower.   Please read over the following fact sheets that you were given:   Surgical Site Infection Prevention

## 2015-06-10 ENCOUNTER — Inpatient Hospital Stay (HOSPITAL_COMMUNITY): Payer: 59 | Admitting: Anesthesiology

## 2015-06-10 ENCOUNTER — Encounter (HOSPITAL_COMMUNITY): Payer: Self-pay | Admitting: Anesthesiology

## 2015-06-10 ENCOUNTER — Encounter (HOSPITAL_COMMUNITY)
Admission: RE | Disposition: A | Discharge status: Home / Self Care | Payer: Self-pay | Source: Ambulatory Visit | Attending: Obstetrics and Gynecology

## 2015-06-10 ENCOUNTER — Inpatient Hospital Stay (HOSPITAL_COMMUNITY): Admission: AD | Admit: 2015-06-10 | Payer: Self-pay | Source: Ambulatory Visit | Admitting: Obstetrics and Gynecology

## 2015-06-10 ENCOUNTER — Inpatient Hospital Stay (HOSPITAL_COMMUNITY)
Admission: RE | Admit: 2015-06-10 | Discharge: 2015-06-13 | DRG: 765 | Disposition: A | Discharge status: Home / Self Care | Payer: 59 | Source: Ambulatory Visit | Attending: Obstetrics and Gynecology | Admitting: Obstetrics and Gynecology

## 2015-06-10 DIAGNOSIS — Z3A39 39 weeks gestation of pregnancy: Secondary | ICD-10-CM | POA: Diagnosis not present

## 2015-06-10 DIAGNOSIS — O99214 Obesity complicating childbirth: Secondary | ICD-10-CM | POA: Diagnosis present

## 2015-06-10 DIAGNOSIS — O34211 Maternal care for low transverse scar from previous cesarean delivery: Principal | ICD-10-CM | POA: Diagnosis present

## 2015-06-10 DIAGNOSIS — J45909 Unspecified asthma, uncomplicated: Secondary | ICD-10-CM | POA: Diagnosis present

## 2015-06-10 DIAGNOSIS — B182 Chronic viral hepatitis C: Secondary | ICD-10-CM | POA: Diagnosis present

## 2015-06-10 DIAGNOSIS — E669 Obesity, unspecified: Secondary | ICD-10-CM | POA: Diagnosis present

## 2015-06-10 DIAGNOSIS — O9952 Diseases of the respiratory system complicating childbirth: Secondary | ICD-10-CM | POA: Diagnosis present

## 2015-06-10 DIAGNOSIS — Z9889 Other specified postprocedural states: Secondary | ICD-10-CM

## 2015-06-10 DIAGNOSIS — O9842 Viral hepatitis complicating childbirth: Secondary | ICD-10-CM | POA: Diagnosis present

## 2015-06-10 DIAGNOSIS — Z6833 Body mass index (BMI) 33.0-33.9, adult: Secondary | ICD-10-CM | POA: Diagnosis not present

## 2015-06-10 LAB — RPR: RPR: NONREACTIVE

## 2015-06-10 SURGERY — Surgical Case
Anesthesia: Spinal

## 2015-06-10 MED ORDER — FAMOTIDINE 20 MG PO TABS
20.0000 mg | ORAL_TABLET | Freq: Two times a day (BID) | ORAL | Status: DC
  Administered 2015-06-10 – 2015-06-13 (×6): 20 mg via ORAL
  Filled 2015-06-10 (×6): qty 1

## 2015-06-10 MED ORDER — LACTATED RINGERS IV SOLN
Freq: Once | INTRAVENOUS | Status: AC
  Administered 2015-06-10: 06:00:00 via INTRAVENOUS

## 2015-06-10 MED ORDER — SODIUM CHLORIDE 0.9 % IR SOLN
Status: DC | PRN
Start: 2015-06-10 — End: 2015-06-10
  Administered 2015-06-10: 1000 mL
  Administered 2015-06-10: 1

## 2015-06-10 MED ORDER — SIMETHICONE 80 MG PO CHEW: 80.0000 mg | CHEWABLE_TABLET | ORAL | Status: DC | PRN

## 2015-06-10 MED ORDER — SENNOSIDES-DOCUSATE SODIUM 8.6-50 MG PO TABS
2.0000 | ORAL_TABLET | ORAL | Status: DC
  Administered 2015-06-11 – 2015-06-13 (×3): 2 via ORAL
  Filled 2015-06-10 (×3): qty 2

## 2015-06-10 MED ORDER — BUTALBITAL-APAP-CAFFEINE 50-325-40 MG PO TABS: 1.0000 | ORAL_TABLET | Freq: Four times a day (QID) | ORAL | Status: DC | PRN

## 2015-06-10 MED ORDER — DIPHENHYDRAMINE HCL 25 MG PO CAPS: 25.0000 mg | ORAL_CAPSULE | ORAL | Status: DC | PRN

## 2015-06-10 MED ORDER — KETOROLAC TROMETHAMINE 30 MG/ML IJ SOLN
INTRAMUSCULAR | Status: AC
  Administered 2015-06-10: 30 mg via INTRAMUSCULAR
  Filled 2015-06-10: qty 1

## 2015-06-10 MED ORDER — COCONUT OIL OIL: 1.0000 "application " | TOPICAL_OIL | Status: DC | PRN

## 2015-06-10 MED ORDER — FLUTICASONE FUROATE-VILANTEROL 200-25 MCG/INH IN AEPB: 1.0000 | INHALATION_SPRAY | Freq: Every day | RESPIRATORY_TRACT | Status: DC

## 2015-06-10 MED ORDER — NALBUPHINE HCL 10 MG/ML IJ SOLN
5.0000 mg | Freq: Once | INTRAMUSCULAR | Status: AC | PRN
  Administered 2015-06-10: 5 mg via SUBCUTANEOUS
  Filled 2015-06-10: qty 1

## 2015-06-10 MED ORDER — MENTHOL 3 MG MT LOZG: 1.0000 | LOZENGE | OROMUCOSAL | Status: DC | PRN

## 2015-06-10 MED ORDER — OXYTOCIN 40 UNITS IN LACTATED RINGERS INFUSION - SIMPLE MED
2.5000 [IU]/h | INTRAVENOUS | Status: AC
  Administered 2015-06-10: 2.5 [IU]/h via INTRAVENOUS
  Filled 2015-06-10: qty 1000

## 2015-06-10 MED ORDER — LACTATED RINGERS IV SOLN: INTRAVENOUS | Status: DC

## 2015-06-10 MED ORDER — LACTATED RINGERS IV SOLN
INTRAVENOUS | Status: DC | PRN
  Administered 2015-06-10 (×2): via INTRAVENOUS

## 2015-06-10 MED ORDER — ACETAMINOPHEN 325 MG PO TABS
650.0000 mg | ORAL_TABLET | ORAL | Status: DC | PRN
  Administered 2015-06-10 – 2015-06-12 (×7): 650 mg via ORAL
  Filled 2015-06-10 (×7): qty 2

## 2015-06-10 MED ORDER — IBUPROFEN 600 MG PO TABS
600.0000 mg | ORAL_TABLET | Freq: Four times a day (QID) | ORAL | Status: DC
  Administered 2015-06-10 – 2015-06-13 (×12): 600 mg via ORAL
  Filled 2015-06-10 (×12): qty 1

## 2015-06-10 MED ORDER — MORPHINE SULFATE (PF) 0.5 MG/ML IJ SOLN
INTRAMUSCULAR | Status: DC | PRN
  Administered 2015-06-10: .2 mg via INTRATHECAL

## 2015-06-10 MED ORDER — WITCH HAZEL-GLYCERIN EX PADS: 1.0000 "application " | MEDICATED_PAD | CUTANEOUS | Status: DC | PRN

## 2015-06-10 MED ORDER — KETOROLAC TROMETHAMINE 30 MG/ML IJ SOLN: 30.0000 mg | Freq: Four times a day (QID) | INTRAMUSCULAR | Status: AC | PRN

## 2015-06-10 MED ORDER — ONDANSETRON HCL 4 MG/2ML IJ SOLN
INTRAMUSCULAR | Status: AC
  Filled 2015-06-10: qty 2

## 2015-06-10 MED ORDER — PHENYLEPHRINE 8 MG IN D5W 100 ML (0.08MG/ML) PREMIX OPTIME
INJECTION | INTRAVENOUS | Status: DC | PRN
  Administered 2015-06-10: 60 ug/min via INTRAVENOUS

## 2015-06-10 MED ORDER — SCOPOLAMINE 1 MG/3DAYS TD PT72
MEDICATED_PATCH | TRANSDERMAL | Status: AC
  Filled 2015-06-10: qty 1

## 2015-06-10 MED ORDER — FENTANYL CITRATE (PF) 100 MCG/2ML IJ SOLN: 25.0000 ug | INTRAMUSCULAR | Status: DC | PRN

## 2015-06-10 MED ORDER — LACTATED RINGERS IV SOLN
INTRAVENOUS | Status: DC | PRN
  Administered 2015-06-10: 08:00:00 via INTRAVENOUS

## 2015-06-10 MED ORDER — PRENATAL MULTIVITAMIN CH
1.0000 | ORAL_TABLET | Freq: Every day | ORAL | Status: DC
  Administered 2015-06-11 – 2015-06-12 (×2): 1 via ORAL
  Filled 2015-06-10 (×3): qty 1

## 2015-06-10 MED ORDER — OXYTOCIN 10 UNIT/ML IJ SOLN
INTRAMUSCULAR | Status: AC
  Filled 2015-06-10: qty 4

## 2015-06-10 MED ORDER — OXYTOCIN 10 UNIT/ML IJ SOLN
40.0000 [IU] | INTRAVENOUS | Status: DC | PRN
  Administered 2015-06-10: 40 [IU] via INTRAVENOUS

## 2015-06-10 MED ORDER — PANTOPRAZOLE SODIUM 40 MG PO TBEC
40.0000 mg | DELAYED_RELEASE_TABLET | Freq: Every day | ORAL | Status: DC
  Administered 2015-06-11 – 2015-06-13 (×3): 40 mg via ORAL
  Filled 2015-06-10 (×3): qty 1

## 2015-06-10 MED ORDER — DIPHENHYDRAMINE HCL 25 MG PO CAPS
25.0000 mg | ORAL_CAPSULE | Freq: Four times a day (QID) | ORAL | Status: DC | PRN
Start: 2015-06-10 — End: 2015-06-13

## 2015-06-10 MED ORDER — ONDANSETRON HCL 4 MG/2ML IJ SOLN: 4.0000 mg | Freq: Three times a day (TID) | INTRAMUSCULAR | Status: DC | PRN

## 2015-06-10 MED ORDER — DIBUCAINE 1 % RE OINT: 1.0000 "application " | TOPICAL_OINTMENT | RECTAL | Status: DC | PRN

## 2015-06-10 MED ORDER — ALBUTEROL SULFATE (2.5 MG/3ML) 0.083% IN NEBU: 3.0000 mL | INHALATION_SOLUTION | Freq: Four times a day (QID) | RESPIRATORY_TRACT | Status: DC | PRN

## 2015-06-10 MED ORDER — FLEET ENEMA 7-19 GM/118ML RE ENEM: 1.0000 | ENEMA | Freq: Every day | RECTAL | Status: DC | PRN

## 2015-06-10 MED ORDER — NALBUPHINE HCL 10 MG/ML IJ SOLN: 5.0000 mg | INTRAMUSCULAR | Status: DC | PRN

## 2015-06-10 MED ORDER — CITRIC ACID-SODIUM CITRATE 334-500 MG/5ML PO SOLN
30.0000 mL | Freq: Once | ORAL | Status: AC
  Administered 2015-06-10: 30 mL via ORAL

## 2015-06-10 MED ORDER — CEFAZOLIN SODIUM-DEXTROSE 2-4 GM/100ML-% IV SOLN
2.0000 g | INTRAVENOUS | Status: AC
  Administered 2015-06-10: 2 g via INTRAVENOUS

## 2015-06-10 MED ORDER — TETANUS-DIPHTH-ACELL PERTUSSIS 5-2.5-18.5 LF-MCG/0.5 IM SUSP: 0.5000 mL | Freq: Once | INTRAMUSCULAR | Status: DC

## 2015-06-10 MED ORDER — CEFAZOLIN SODIUM-DEXTROSE 2-4 GM/100ML-% IV SOLN
INTRAVENOUS | Status: AC
  Filled 2015-06-10: qty 100

## 2015-06-10 MED ORDER — OXYCODONE HCL 5 MG PO TABS
5.0000 mg | ORAL_TABLET | ORAL | Status: DC | PRN
  Administered 2015-06-11 – 2015-06-12 (×4): 5 mg via ORAL
  Filled 2015-06-10 (×4): qty 1

## 2015-06-10 MED ORDER — PHENYLEPHRINE 8 MG IN D5W 100 ML (0.08MG/ML) PREMIX OPTIME
INJECTION | INTRAVENOUS | Status: AC
  Filled 2015-06-10: qty 100

## 2015-06-10 MED ORDER — SODIUM CHLORIDE 0.9% FLUSH: 3.0000 mL | INTRAVENOUS | Status: DC | PRN

## 2015-06-10 MED ORDER — FERROUS SULFATE 325 (65 FE) MG PO TABS
325.0000 mg | ORAL_TABLET | Freq: Two times a day (BID) | ORAL | Status: DC
  Administered 2015-06-11 – 2015-06-13 (×5): 325 mg via ORAL
  Filled 2015-06-10 (×5): qty 1

## 2015-06-10 MED ORDER — MEASLES, MUMPS & RUBELLA VAC ~~LOC~~ INJ: 0.5000 mL | INJECTION | Freq: Once | SUBCUTANEOUS | Status: DC

## 2015-06-10 MED ORDER — DIPHENHYDRAMINE HCL 50 MG/ML IJ SOLN: 12.5000 mg | INTRAMUSCULAR | Status: DC | PRN

## 2015-06-10 MED ORDER — OXYCODONE HCL 5 MG PO TABS
10.0000 mg | ORAL_TABLET | ORAL | Status: DC | PRN
Start: 2015-06-10 — End: 2015-06-12
  Administered 2015-06-12: 10 mg via ORAL
  Filled 2015-06-10: qty 2

## 2015-06-10 MED ORDER — NALOXONE HCL 0.4 MG/ML IJ SOLN: 0.4000 mg | INTRAMUSCULAR | Status: DC | PRN

## 2015-06-10 MED ORDER — ZOLPIDEM TARTRATE 5 MG PO TABS: 5.0000 mg | ORAL_TABLET | Freq: Every evening | ORAL | Status: DC | PRN

## 2015-06-10 MED ORDER — NALOXONE HCL 2 MG/2ML IJ SOSY
1.0000 ug/kg/h | PREFILLED_SYRINGE | INTRAVENOUS | Status: DC | PRN
  Filled 2015-06-10: qty 2

## 2015-06-10 MED ORDER — CITRIC ACID-SODIUM CITRATE 334-500 MG/5ML PO SOLN
ORAL | Status: AC
  Filled 2015-06-10: qty 15

## 2015-06-10 MED ORDER — SIMETHICONE 80 MG PO CHEW
80.0000 mg | CHEWABLE_TABLET | Freq: Three times a day (TID) | ORAL | Status: DC
  Administered 2015-06-10 – 2015-06-13 (×10): 80 mg via ORAL
  Filled 2015-06-10 (×10): qty 1

## 2015-06-10 MED ORDER — MEPERIDINE HCL 25 MG/ML IJ SOLN: 6.2500 mg | INTRAMUSCULAR | Status: DC | PRN

## 2015-06-10 MED ORDER — METHYLERGONOVINE MALEATE 0.2 MG/ML IJ SOLN: 0.2000 mg | INTRAMUSCULAR | Status: DC | PRN

## 2015-06-10 MED ORDER — MOMETASONE FURO-FORMOTEROL FUM 200-5 MCG/ACT IN AERO
2.0000 | INHALATION_SPRAY | Freq: Two times a day (BID) | RESPIRATORY_TRACT | Status: DC
  Filled 2015-06-10: qty 8.8

## 2015-06-10 MED ORDER — BUPIVACAINE IN DEXTROSE 0.75-8.25 % IT SOLN
INTRATHECAL | Status: DC | PRN
  Administered 2015-06-10: 1.6 mL via INTRATHECAL

## 2015-06-10 MED ORDER — BISACODYL 10 MG RE SUPP: 10.0000 mg | Freq: Every day | RECTAL | Status: DC | PRN

## 2015-06-10 MED ORDER — KETOROLAC TROMETHAMINE 30 MG/ML IJ SOLN
30.0000 mg | Freq: Four times a day (QID) | INTRAMUSCULAR | Status: AC | PRN
  Administered 2015-06-10: 30 mg via INTRAMUSCULAR

## 2015-06-10 MED ORDER — SCOPOLAMINE 1 MG/3DAYS TD PT72: 1.0000 | MEDICATED_PATCH | Freq: Once | TRANSDERMAL | Status: DC

## 2015-06-10 MED ORDER — SIMETHICONE 80 MG PO CHEW
80.0000 mg | CHEWABLE_TABLET | ORAL | Status: DC
  Administered 2015-06-11 – 2015-06-13 (×3): 80 mg via ORAL
  Filled 2015-06-10 (×3): qty 1

## 2015-06-10 MED ORDER — METHYLERGONOVINE MALEATE 0.2 MG PO TABS: 0.2000 mg | ORAL_TABLET | ORAL | Status: DC | PRN

## 2015-06-10 MED ORDER — ONDANSETRON HCL 4 MG/2ML IJ SOLN
INTRAMUSCULAR | Status: DC | PRN
  Administered 2015-06-10: 4 mg via INTRAVENOUS

## 2015-06-10 MED ORDER — FENTANYL CITRATE (PF) 100 MCG/2ML IJ SOLN
INTRAMUSCULAR | Status: AC
  Filled 2015-06-10: qty 2

## 2015-06-10 MED ORDER — FENTANYL CITRATE (PF) 100 MCG/2ML IJ SOLN
INTRAMUSCULAR | Status: DC | PRN
  Administered 2015-06-10: 10 ug via INTRATHECAL

## 2015-06-10 MED ORDER — SCOPOLAMINE 1 MG/3DAYS TD PT72
1.0000 | MEDICATED_PATCH | Freq: Once | TRANSDERMAL | Status: DC
  Administered 2015-06-10: 1.5 mg via TRANSDERMAL

## 2015-06-10 MED ORDER — MORPHINE SULFATE (PF) 0.5 MG/ML IJ SOLN
INTRAMUSCULAR | Status: AC
  Filled 2015-06-10: qty 10

## 2015-06-10 MED ORDER — NALBUPHINE HCL 10 MG/ML IJ SOLN: 5.0000 mg | Freq: Once | INTRAMUSCULAR | Status: AC | PRN

## 2015-06-10 SURGICAL SUPPLY — 36 items
APL SKNCLS STERI-STRIP NONHPOA (GAUZE/BANDAGES/DRESSINGS) ×1
BENZOIN TINCTURE PRP APPL 2/3 (GAUZE/BANDAGES/DRESSINGS) ×3 IMPLANT
BLADE SURG 10 STRL SS (BLADE) ×2 IMPLANT
CLAMP CORD UMBIL (MISCELLANEOUS) IMPLANT
CLOSURE WOUND 1/2 X4 (GAUZE/BANDAGES/DRESSINGS) ×1
CLOTH BEACON ORANGE TIMEOUT ST (SAFETY) ×3 IMPLANT
DRSG OPSITE POSTOP 4X10 (GAUZE/BANDAGES/DRESSINGS) ×3 IMPLANT
DURAPREP 26ML APPLICATOR (WOUND CARE) ×3 IMPLANT
ELECT REM PT RETURN 9FT ADLT (ELECTROSURGICAL) ×3
ELECTRODE REM PT RTRN 9FT ADLT (ELECTROSURGICAL) ×1 IMPLANT
EXTRACTOR VACUUM BELL STYLE (SUCTIONS) IMPLANT
GLOVE BIO SURGEON STRL SZ7 (GLOVE) ×3 IMPLANT
GLOVE BIOGEL PI IND STRL 7.0 (GLOVE) ×1 IMPLANT
GLOVE BIOGEL PI INDICATOR 7.0 (GLOVE) ×2
GOWN STRL REUS W/TWL LRG LVL3 (GOWN DISPOSABLE) ×6 IMPLANT
KIT ABG SYR 3ML LUER SLIP (SYRINGE) IMPLANT
NDL HYPO 25X5/8 SAFETYGLIDE (NEEDLE) IMPLANT
NEEDLE HYPO 25X5/8 SAFETYGLIDE (NEEDLE) IMPLANT
NS IRRIG 1000ML POUR BTL (IV SOLUTION) ×3 IMPLANT
PACK C SECTION WH (CUSTOM PROCEDURE TRAY) ×3 IMPLANT
PAD ABD 7.5X8 STRL (GAUZE/BANDAGES/DRESSINGS) ×2 IMPLANT
PAD OB MATERNITY 4.3X12.25 (PERSONAL CARE ITEMS) ×3 IMPLANT
PENCIL SMOKE EVAC W/HOLSTER (ELECTROSURGICAL) ×3 IMPLANT
RTRCTR C-SECT PINK 25CM LRG (MISCELLANEOUS) ×3 IMPLANT
SPONGE GAUZE 4X4 12PLY (GAUZE/BANDAGES/DRESSINGS) ×2 IMPLANT
STRIP CLOSURE SKIN 1/2X4 (GAUZE/BANDAGES/DRESSINGS) ×2 IMPLANT
SUT MNCRL 0 VIOLET CTX 36 (SUTURE) ×2 IMPLANT
SUT MONOCRYL 0 CTX 36 (SUTURE) ×4
SUT PLAIN 2 0 XLH (SUTURE) ×2 IMPLANT
SUT VIC AB 0 CT1 27 (SUTURE) ×6
SUT VIC AB 0 CT1 27XBRD ANBCTR (SUTURE) ×2 IMPLANT
SUT VIC AB 2-0 CT1 27 (SUTURE) ×3
SUT VIC AB 2-0 CT1 TAPERPNT 27 (SUTURE) ×1 IMPLANT
SUT VIC AB 4-0 KS 27 (SUTURE) ×3 IMPLANT
TOWEL OR 17X24 6PK STRL BLUE (TOWEL DISPOSABLE) ×3 IMPLANT
TRAY FOLEY CATH SILVER 14FR (SET/KITS/TRAYS/PACK) ×3 IMPLANT

## 2015-06-10 NOTE — Anesthesia Procedure Notes (Signed)
Spinal Patient location during procedure: OR Staffing Anesthesiologist: Maisa Bedingfield Performed by: anesthesiologist  Preanesthetic Checklist Completed: patient identified, site marked, surgical consent, pre-op evaluation, timeout performed, IV checked, risks and benefits discussed and monitors and equipment checked Spinal Block Patient position: sitting Prep: DuraPrep Patient monitoring: continuous pulse ox, blood pressure and heart rate Approach: midline Location: L3-4 Injection technique: single-shot Needle Needle type: Pencan  Needle gauge: 24 G Needle length: 9 cm Additional Notes Functioning IV was confirmed and monitors were applied. Sterile prep and drape, including hand hygiene, mask and sterile gloves were used. The patient was positioned and the spine was prepped. The skin was anesthetized with lidocaine.  Free flow of clear CSF was obtained prior to injecting local anesthetic into the CSF.  The spinal needle aspirated freely following injection.  The needle was carefully withdrawn.  The patient tolerated the procedure well. Consent was obtained prior to procedure with all questions answered and concerns addressed. Risks including but not limited to bleeding, infection, nerve damage, paralysis, failed block, inadequate analgesia, allergic reaction, high spinal, itching and headache were discussed and the patient wished to proceed.   Wendy Pustejovsky, MD     

## 2015-06-10 NOTE — Anesthesia Postprocedure Evaluation (Signed)
Anesthesia Post Note  Patient: Wendy Nunez  Procedure(s) Performed: Procedure(s) (LRB): REPEAT CESAREAN SECTION (N/A)  Patient location during evaluation: PACU Anesthesia Type: Spinal Level of consciousness: oriented and awake and alert Pain management: pain level controlled Vital Signs Assessment: post-procedure vital signs reviewed and stable Respiratory status: spontaneous breathing, respiratory function stable and patient connected to nasal cannula oxygen Cardiovascular status: blood pressure returned to baseline and stable Postop Assessment: no headache, no backache and spinal receding Anesthetic complications: no     Last Vitals:  Filed Vitals:   06/10/15 0930 06/10/15 0945  BP: 107/73 113/74  Pulse: 66 68  Temp:    Resp: 15 13    Last Pain:  Filed Vitals:   06/10/15 0955  PainSc: 0-No pain   Pain Goal:                 Shelton SilvasKevin D Annaleah Arata

## 2015-06-10 NOTE — Op Note (Signed)
06/10/2015  8:25 AM  PATIENT:  Wendy Nunez  30 y.o. female  PRE-OPERATIVE DIAGNOSIS:  repeat low transverse c-section   POST-OPERATIVE DIAGNOSIS:  repeat low transverse c-section   PROCEDURE:  Procedure(s): REPEAT CESAREAN SECTION (N/A)  SURGEON:  Surgeon(s) and Role:    * Charlottie Peragine, MD - Primary  ASSISTANTS: Dr. Kaplan   ANESTHESIA:   spinal  EBL:  Total I/O In: 2000 [I.V.:2000] Out: 800 [Urine:200; Blood:600]   SPECIMEN:  No Specimen  DISPOSITION OF SPECIMEN:  N/A  COUNTS:  YES  TOURNIQUET:  * No tourniquets in log *  DICTATION: .Note written in EPIC  PLAN OF CARE: Admit to inpatient   PATIENT DISPOSITION:  PACU - hemodynamically stable.   Delay start of Pharmacological VTE agent (>24hrs) due to surgical blood loss or risk of bleeding: not applicable  Complications:  none Medications:  Ancef, Pitocin Findings:  Baby female, Apgars 8,9, weight P.   Normal tubes, ovaries and uterus seen.  Baby was skin to skin with mother after birth in the OR.  Technique:  After adequate spinal anesthesia was achieved, the patient was prepped and draped in usual sterile fashion.  A foley catheter was used to drain the bladder.  A pfannanstiel incision was made with the scalpel and carried down to the fascia with the bovie cautery. The fascia was incised in the midline with the scalpel and carried in a transverse curvilinear manner bilaterally.  The fascia was reflected superiorly and inferiorly off the rectus muscles and the muscles split in the midline.  A bowel free portion of the peritoneum was entered bluntly and then extended in a superior and inferior manner with good visualization of the bowel and bladder.  The Alexis instrument was then placed and the vesico-uterine fascia tented up and incised in a transverse curvilinear manner.  A 2 cm transverse incision was made in the upper portion of the lower uterine segment until the amnion was exposed.   The incision was  extended transversely in a blunt manner.  Light mec fluid was noted and the baby delivered in the vertex presentation without complication.  The baby was bulb suctioned and the cord was clamped and cut.  The baby was then handed to awaiting Neonatology.  The placenta was then delivered manually and the uterus cleared of all debris.  The uterine incision was then closed with a running lock stitch of 0 monocryl.  An imbricating layer of 0 monocryl was closed as well. Excellent hemostasis of the uterine incision was achieved and the abdomen was cleared with irrigation.  The peritoneum was closed with a running stitch of 2-0 vicryl.  This incorporated the rectus muscles as a separate layer.  The fascia was then closed with a running stitch of 0 vicryl.  The subcutaneous layer was closed with interrupted  stitches of 2-0 plain gut.  The skin was closed with 4-0 vicryl on a Keith needle and steri-strips.  The patient tolerated the procedure well and was returned to the recovery room in stable condition.  All counts were correct times three.  Baby was doing skin to skin with mother in OR.  Blakelee Allington A   

## 2015-06-10 NOTE — Addendum Note (Signed)
Addendum  created 06/10/15 1541 by Elgie CongoNataliya H Thurmon Mizell, CRNA   Modules edited: Clinical Notes   Clinical Notes:  File: [id number]

## 2015-06-10 NOTE — Consult Note (Signed)
Neonatology Note:   Attendance at C-section:    I was asked by Dr. Henderson CloudHorvath to attend this repeat C/S at term. The mother is a G2P1, GBS not done with good prenatal care. ROM 0 hours before delivery, fluid thin meconium. Infant vigorous with good spontaneous cry and tone. Needed only minimal bulb suctioning. Ap 8/9. Lungs clear to ausc in DR. To CN to care of Pediatrician.  Dineen Kidavid C. Leary RocaEhrmann, MD

## 2015-06-10 NOTE — Anesthesia Postprocedure Evaluation (Signed)
Anesthesia Post Note  Patient: Wendy Nunez  Procedure(s) Performed: Procedure(s) (LRB): REPEAT CESAREAN SECTION (N/A)  Patient location during evaluation: Mother Baby Anesthesia Type: Spinal Level of consciousness: oriented and awake and alert Pain management: pain level controlled Vital Signs Assessment: post-procedure vital signs reviewed and stable Respiratory status: spontaneous breathing and nonlabored ventilation Cardiovascular status: stable Postop Assessment: spinal receding, patient able to bend at knees, no signs of nausea or vomiting and adequate PO intake Anesthetic complications: no     Last Vitals:  Filed Vitals:   06/10/15 1230 06/10/15 1345  BP: 113/58 120/72  Pulse: 72 85  Temp: 36.8 C 36.8 C  Resp: 18 18    Last Pain:  Filed Vitals:   06/10/15 1520  PainSc: 6    Pain Goal:                 Land O'LakesMalinova,Hoke Baer Hristova

## 2015-06-10 NOTE — Progress Notes (Signed)
There has been no change in the patients history, status or exam since the history and physical.  Filed Vitals:   06/10/15 0555  BP: 124/89  Pulse: 80  Temp: 97.8 F (36.6 C)  TempSrc: Oral  Resp: 20  SpO2: 100%    Lab Results  Component Value Date   WBC 11.6* 06/09/2015   HGB 10.6* 06/09/2015   HCT 32.0* 06/09/2015   MCV 78.8 06/09/2015   PLT 272 06/09/2015    Wendy Nunez A

## 2015-06-10 NOTE — Transfer of Care (Signed)
Immediate Anesthesia Transfer of Care Note  Patient: Wendy Nunez  Procedure(s) Performed: Procedure(s): REPEAT CESAREAN SECTION (N/A)  Patient Location: PACU  Anesthesia Type:Spinal  Level of Consciousness: awake, alert  and oriented  Airway & Oxygen Therapy: Patient Spontanous Breathing  Post-op Assessment: Report given to RN and Post -op Vital signs reviewed and stable  Post vital signs: Reviewed and stable  Last Vitals:  Filed Vitals:   06/10/15 0555  BP: 124/89  Pulse: 80  Temp: 36.6 C  Resp: 20    Last Pain: There were no vitals filed for this visit.       Complications: No apparent anesthesia complications

## 2015-06-10 NOTE — Brief Op Note (Signed)
06/10/2015  8:25 AM  PATIENT:  Wendy Nunez  30 y.o. female  PRE-OPERATIVE DIAGNOSIS:  repeat low transverse c-section   POST-OPERATIVE DIAGNOSIS:  repeat low transverse c-section   PROCEDURE:  Procedure(s): REPEAT CESAREAN SECTION (N/A)  SURGEON:  Surgeon(s) and Role:    * Carrington ClampMichelle Ardean Simonich, MD - Primary  ASSISTANTS: Dr. Arlyce DiceKaplan   ANESTHESIA:   spinal  EBL:  Total I/O In: 2000 [I.V.:2000] Out: 800 [Urine:200; Blood:600]   SPECIMEN:  No Specimen  DISPOSITION OF SPECIMEN:  N/A  COUNTS:  YES  TOURNIQUET:  * No tourniquets in log *  DICTATION: .Note written in EPIC  PLAN OF CARE: Admit to inpatient   PATIENT DISPOSITION:  PACU - hemodynamically stable.   Delay start of Pharmacological VTE agent (>24hrs) due to surgical blood loss or risk of bleeding: not applicable  Complications:  none Medications:  Ancef, Pitocin Findings:  Baby female, Apgars 8,9, weight P.   Normal tubes, ovaries and uterus seen.  Baby was skin to skin with mother after birth in the OR.  Technique:  After adequate spinal anesthesia was achieved, the patient was prepped and draped in usual sterile fashion.  A foley catheter was used to drain the bladder.  A pfannanstiel incision was made with the scalpel and carried down to the fascia with the bovie cautery. The fascia was incised in the midline with the scalpel and carried in a transverse curvilinear manner bilaterally.  The fascia was reflected superiorly and inferiorly off the rectus muscles and the muscles split in the midline.  A bowel free portion of the peritoneum was entered bluntly and then extended in a superior and inferior manner with good visualization of the bowel and bladder.  The Alexis instrument was then placed and the vesico-uterine fascia tented up and incised in a transverse curvilinear manner.  A 2 cm transverse incision was made in the upper portion of the lower uterine segment until the amnion was exposed.   The incision was  extended transversely in a blunt manner.  Light mec fluid was noted and the baby delivered in the vertex presentation without complication.  The baby was bulb suctioned and the cord was clamped and cut.  The baby was then handed to awaiting Neonatology.  The placenta was then delivered manually and the uterus cleared of all debris.  The uterine incision was then closed with a running lock stitch of 0 monocryl.  An imbricating layer of 0 monocryl was closed as well. Excellent hemostasis of the uterine incision was achieved and the abdomen was cleared with irrigation.  The peritoneum was closed with a running stitch of 2-0 vicryl.  This incorporated the rectus muscles as a separate layer.  The fascia was then closed with a running stitch of 0 vicryl.  The subcutaneous layer was closed with interrupted  stitches of 2-0 plain gut.  The skin was closed with 4-0 vicryl on a Keith needle and steri-strips.  The patient tolerated the procedure well and was returned to the recovery room in stable condition.  All counts were correct times three.  Baby was doing skin to skin with mother in OR.  Malakhi Markwood A

## 2015-06-10 NOTE — Progress Notes (Signed)
Dr. Mora ApplPinn notified at 1203 about patient's saturated honeycomb dressing. Verbal orders given to change dressing and call back if it saturates again.

## 2015-06-11 LAB — CBC
HEMATOCRIT: 27.6 % — AB (ref 36.0–46.0)
HEMOGLOBIN: 8.6 g/dL — AB (ref 12.0–15.0)
MCH: 24.4 pg — ABNORMAL LOW (ref 26.0–34.0)
MCHC: 31.2 g/dL (ref 30.0–36.0)
MCV: 78.4 fL (ref 78.0–100.0)
PLATELETS: 212 10*3/uL (ref 150–400)
RBC: 3.52 MIL/uL — ABNORMAL LOW (ref 3.87–5.11)
RDW: 16.2 % — AB (ref 11.5–15.5)
WBC: 10.1 10*3/uL (ref 4.0–10.5)

## 2015-06-11 MED ORDER — MORPHINE SULFATE (PF) 0.5 MG/ML IJ SOLN
INTRAMUSCULAR | Status: AC
  Filled 2015-06-11: qty 10

## 2015-06-11 NOTE — Progress Notes (Signed)
Subjective: Postpartum Day 1: Cesarean Delivery Patient reports incisional pain, tolerating PO, + flatus and no problems voiding.    Objective: Vital signs in last 24 hours: Temp:  [97.5 F (36.4 C)-98.2 F (36.8 C)] 97.7 F (36.5 C) (05/20 0515) Pulse Rate:  [59-85] 59 (05/20 0515) Resp:  [13-18] 18 (05/20 0515) BP: (100-125)/(58-99) 112/60 mmHg (05/20 0515) SpO2:  [96 %-100 %] 98 % (05/20 0515)  Physical Exam:  General: alert, cooperative and no distress Lochia: appropriate ABD: Soft, appropriately tender, no rebound or guarding Uterine Fundus: firm Incision: healing well, no significant drainage, no dehiscence, pressure dressing removed, honey comb dry DVT Evaluation: No evidence of DVT seen on physical exam. Negative Homan's sign. No cords or calf tenderness.   Recent Labs  06/09/15 1010 06/11/15 0539  HGB 10.6* 8.6*  HCT 32.0* 27.6*    Assessment/Plan: Status post Cesarean section. Doing well postoperatively.  Routine post op care.   Essie HartINN, Xaden Kaufman STACIA 06/11/2015, 8:12 AM

## 2015-06-12 MED ORDER — OXYCODONE-ACETAMINOPHEN 5-325 MG PO TABS
2.0000 | ORAL_TABLET | ORAL | Status: DC
  Administered 2015-06-12 – 2015-06-13 (×5): 2 via ORAL
  Filled 2015-06-12 (×6): qty 2

## 2015-06-12 MED ORDER — OXYCODONE HCL 5 MG PO TABS: 5.0000 mg | ORAL_TABLET | ORAL | Status: DC | PRN

## 2015-06-12 NOTE — Progress Notes (Signed)
Subjective: Postpartum Day 2: Cesarean Delivery Patient reports incisional pain, tolerating PO, + flatus and no problems voiding.   Patient notes that her pain is severe and she has not been getting  The opiod for pain. She denies nausea or vomiting.   Objective: Vital signs in last 24 hours: Temp:  [98.3 F (36.8 C)] 98.3 F (36.8 C) (05/21 0513) Pulse Rate:  [70] 70 (05/21 0513) Resp:  [18] 18 (05/21 0513) BP: (112)/(66) 112/66 mmHg (05/21 0513) SpO2:  [99 %] 99 % (05/21 0513)  Physical Exam:  General: alert, cooperative and no distress Lochia: appropriate Uterine Fundus: firm  ABD:  Soft, Tender to palpation diffusely no rebound or guarding.  Incision: healing well, no significant drainage, no dehiscence, no significant erythema DVT Evaluation: No evidence of DVT seen on physical exam. Negative Homan's sign. No cords or calf tenderness.   Recent Labs  06/09/15 1010 06/11/15 0539  HGB 10.6* 8.6*  HCT 32.0* 27.6*    Assessment/Plan: Status post Cesarean section. With post operative pain.  Discontinued the isolated oxycodone and re-wrote rx for Roxicet  To be scheduled q4 hrs. Patient counseled that this is how she should take it At home for the next week and then wean off.  She also was counseled to take Ibuprofen regularly as this will be additive with the Narcotic.   Continue current care. Plan for dispo tomorrow  Essie HartINN, Quadry Kampa [facility] 06/12/2015, 10:08 AM

## 2015-06-13 ENCOUNTER — Encounter (HOSPITAL_COMMUNITY): Payer: Self-pay | Admitting: Obstetrics and Gynecology

## 2015-06-13 MED ORDER — FERROUS SULFATE 325 (65 FE) MG PO TABS: 325.0000 mg | ORAL_TABLET | Freq: Two times a day (BID) | ORAL | Status: AC

## 2015-06-13 MED ORDER — MEASLES, MUMPS & RUBELLA VAC ~~LOC~~ INJ
0.5000 mL | INJECTION | Freq: Once | SUBCUTANEOUS | Status: AC
  Administered 2015-06-13: 0.5 mL via SUBCUTANEOUS
  Filled 2015-06-13: qty 0.5

## 2015-06-13 MED ORDER — OXYCODONE-ACETAMINOPHEN 5-325 MG PO TABS: 2.0000 | ORAL_TABLET | ORAL | Status: DC

## 2015-06-13 MED FILL — OXYCODONE/APAP 5-325: 5-325 | 2 days supply | Qty: 30 | Fill #0

## 2015-06-13 MED FILL — ONDANSETRON ODT 8 MG TABLET: 8 | 10 days supply | Qty: 30 | Fill #6

## 2015-06-13 MED FILL — FERROUS SULFATE 325 MG TAB: 325 (65 FE) | 50 days supply | Qty: 100 | Fill #0

## 2015-06-13 NOTE — Progress Notes (Signed)
Nauseated this am.  Meds withheld per patient request

## 2015-06-13 NOTE — Discharge Summary (Signed)
Obstetric Discharge Summary Reason for Admission: cesarean section Prenatal Procedures: ultrasound Intrapartum Procedures: cesarean: low cervical, transverse Postpartum Procedures: none Complications-Operative and Postpartum: none HEMOGLOBIN  Date Value Ref Range Status  06/11/2015 8.6* 12.0 - 15.0 g/dL Final   HCT  Date Value Ref Range Status  06/11/2015 27.6* 36.0 - 46.0 % Final    Physical Exam:  General: alert and cooperative Lochia: appropriate Uterine Fundus: soft Incision: healing well, no significant drainage DVT Evaluation: No evidence of DVT seen on physical exam.  Discharge Diagnoses: Term Pregnancy-delivered  Discharge Information: Date: 06/13/2015 Activity: pelvic rest Diet: routine Medications: PNV, Ibuprofen, Iron and Percocet Condition: stable Instructions: refer to practice specific booklet Discharge to: home Follow-up Information    Follow up with HORVATH,MICHELLE A, MD In 4 weeks.   Specialty:  Obstetrics and Gynecology   Contact information:   [address] [place] [id number] [phone]       Newborn Data: Live born female  Birth Weight: 7 lb 3.5 oz (3275 g) APGAR: 8, 9  Home with mother.  Wendy Nunez, Wendy Nunez 06/13/2015, 10:17 AM

## 2015-08-02 DIAGNOSIS — H52203 Unspecified astigmatism, bilateral: Secondary | ICD-10-CM | POA: Diagnosis not present

## 2015-08-02 DIAGNOSIS — H5213 Myopia, bilateral: Secondary | ICD-10-CM | POA: Diagnosis not present

## 2015-08-12 DIAGNOSIS — Z3202 Encounter for pregnancy test, result negative: Secondary | ICD-10-CM | POA: Diagnosis not present

## 2015-08-12 DIAGNOSIS — Z30430 Encounter for insertion of intrauterine contraceptive device: Secondary | ICD-10-CM | POA: Diagnosis not present

## 2016-01-17 ENCOUNTER — Ambulatory Visit (HOSPITAL_BASED_OUTPATIENT_CLINIC_OR_DEPARTMENT_OTHER)
Admission: RE | Admit: 2016-01-17 | Discharge: 2016-01-17 | Disposition: A | Discharge status: Home / Self Care | Payer: BLUE CROSS/BLUE SHIELD | Source: Ambulatory Visit | Attending: Medical | Admitting: Medical

## 2016-01-17 ENCOUNTER — Ambulatory Visit (INDEPENDENT_AMBULATORY_CARE_PROVIDER_SITE_OTHER): Payer: BLUE CROSS/BLUE SHIELD | Admitting: Medical

## 2016-01-17 ENCOUNTER — Telehealth: Payer: Self-pay | Admitting: Medical

## 2016-01-17 ENCOUNTER — Encounter: Payer: Self-pay | Admitting: Medical

## 2016-01-17 VITALS — BP 128/83 | HR 77 | Temp 98.4°F | Ht 63.0 in | Wt 171.6 lb

## 2016-01-17 DIAGNOSIS — M898X2 Other specified disorders of bone, upper arm: Secondary | ICD-10-CM | POA: Insufficient documentation

## 2016-01-17 DIAGNOSIS — M542 Cervicalgia: Secondary | ICD-10-CM | POA: Insufficient documentation

## 2016-01-17 DIAGNOSIS — M25521 Pain in right elbow: Secondary | ICD-10-CM

## 2016-01-17 DIAGNOSIS — R112 Nausea with vomiting, unspecified: Secondary | ICD-10-CM

## 2016-01-17 DIAGNOSIS — S0093XA Contusion of unspecified part of head, initial encounter: Secondary | ICD-10-CM | POA: Insufficient documentation

## 2016-01-17 DIAGNOSIS — S0990XA Unspecified injury of head, initial encounter: Secondary | ICD-10-CM | POA: Diagnosis present

## 2016-01-17 DIAGNOSIS — W19XXXA Unspecified fall, initial encounter: Secondary | ICD-10-CM | POA: Diagnosis not present

## 2016-01-17 MED ORDER — DICLOFENAC SODIUM 75 MG PO TBEC: 75.0000 mg | DELAYED_RELEASE_TABLET | Freq: Two times a day (BID) | ORAL | 0 refills | Status: AC

## 2016-01-17 MED ORDER — AMOXICILLIN-POT CLAVULANATE 875-125 MG PO TABS: 1.0000 | ORAL_TABLET | Freq: Two times a day (BID) | ORAL | 0 refills | Status: AC

## 2016-01-17 MED ORDER — TRAMADOL HCL 50 MG PO TABS: 50.0000 mg | ORAL_TABLET | Freq: Three times a day (TID) | ORAL | 0 refills | Status: AC | PRN

## 2016-01-17 MED ORDER — KETOROLAC TROMETHAMINE 60 MG/2ML IM SOLN
60.0000 mg | Freq: Once | INTRAMUSCULAR | Status: AC
  Administered 2016-01-17: 60 mg via INTRAMUSCULAR

## 2016-01-17 MED FILL — AMOX-CLAV 875-125 MG TABLET: 875-125 | 10 days supply | Qty: 20 | Fill #0

## 2016-01-17 MED FILL — DICLOFENAC SOD 75 MG TAB EC: 75 | 15 days supply | Qty: 30 | Fill #0

## 2016-01-17 MED FILL — traMADol HCL 50 MG TABS: 50 | 4 days supply | Qty: 12 | Fill #0

## 2016-01-17 NOTE — Telephone Encounter (Signed)
Office note is done. So you can work on [employer].

## 2016-01-17 NOTE — Progress Notes (Signed)
Subjective:    Patient ID: Celso Sicklerystal K Mcinerny, female    DOB: 03/15/1985, 30 y.o.   MRN: [id number]  HPI  Pt in for evaluation.  Pt was on her daughter hover board.   She was trying to get off hover board. She hit her head on hardwood floor(no loc). She vomited 2 times afterwards. This happened 10 am yesterday. Her family recommended she get evaluated at ED. Pt did not go. Pt report mild ha now is mild to moderate. No vision changes. No slurred speech and no gross motor or sensory function deficits.   Today no vomiting. Pt felt mild nausea today.   No gross motor or sensory function deficits.  Pt ha is moderate. No dizziness or gross motor/sensory function deficits. Reported.  LMP- IUD mirena.     Review of Systems  Constitutional: Negative for chills and fever.  Respiratory: Negative for cough, chest tightness, shortness of breath and wheezing.   Cardiovascular: Negative for chest pain and palpitations.  Gastrointestinal: Positive for nausea. Negative for abdominal pain and vomiting.       See hpi.  Musculoskeletal: Positive for neck pain. Negative for back pain and neck stiffness.       Elbow pain.  Skin: Negative for rash.  Neurological: Positive for headaches. Negative for dizziness, tremors, seizures, syncope, speech difficulty, weakness and numbness.  Hematological: Negative for adenopathy. Does not bruise/bleed easily.  Psychiatric/Behavioral: Negative for behavioral problems and confusion.    Past Medical History:  Diagnosis Date  . Asthma    bring inhalers to hospital  . Bronchitis   . Complication of anesthesia    temp. dropped and O2 dropped  . Hepatitis C carrier (HCC)   . Seasonal allergies      Social History   Social History  . Marital status: Married    Spouse name: N/A  . Number of children: N/A  . Years of education: N/A   Occupational History  . Not on file.   Social History Main Topics  . Smoking status: Never Smoker  . Smokeless  tobacco: Never Used  . Alcohol use No  . Drug use: No  . Sexual activity: Yes   Other Topics Concern  . 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    Past Surgical History:  Procedure Laterality Date  . CESAREAN SECTION    . CESAREAN SECTION N/A 06/10/2015   Procedure: REPEAT CESAREAN SECTION;  Surgeon: Carrington ClampMichelle Horvath, MD;  [facility];  Service: Obstetrics;  Laterality: N/A;  . WISDOM TOOTH EXTRACTION      Family History  Problem Relation Age of Onset  . Arthritis Father   . Cancer Father     bladder cancer  . COPD Father   . Heart disease Father   . Hypertension Father   . Hyperlipidemia Father   . Kidney disease Father   . Hypertension Maternal Grandmother   . Hyperlipidemia Maternal Grandmother   . Stroke Maternal Grandmother   . Deep vein thrombosis Maternal Grandmother   . Heart attack Maternal Grandmother     Allergies  Allergen Reactions  . Phenergan [Promethazine Hcl] Other (See Comments)    Altered behaviors/mental state     Current Outpatient Prescriptions on File Prior to Visit  Medication Sig Dispense Refill  . acetaminophen (TYLENOL) 500 MG tablet Take 500 mg by mouth every 6 (six)  hours as needed for moderate pain or headache.     . albuterol (PROVENTIL HFA;VENTOLIN HFA) 108 (90 Base) MCG/ACT inhaler Inhale 1-2 puffs into the lungs every 6 (six) hours as needed for wheezing or shortness of breath.    . ferrous sulfate 325 (65 FE) MG tablet Take 1 tablet (325 mg total) by mouth 2 (two) times daily with a meal. (Patient not taking: Reported on 01/17/2016) 60 tablet 3  . Fluticasone-Salmeterol (ADVAIR) 250-50 MCG/DOSE AEPB Inhale 1 puff into the lungs 2 (two) times daily.    . Prenatal Vit-Fe Fumarate-FA (PRENATAL MULTIVITAMIN) TABS tablet Take 1 tablet by mouth daily at 12 noon.    . simethicone (MYLICON) 80 MG chewable tablet Chew 80 mg by mouth at bedtime as  needed for flatulence.     No current facility-administered medications on file prior to visit.     BP 128/83 (BP Location: Left Arm, Patient Position: Sitting, Cuff Size: Small)   Pulse 77   Temp 98.4 F (36.9 C) (Oral)   Ht 5\' 3"  (1.6 m)   Wt 171 lb 9.6 oz (77.8 kg)   LMP 01/10/2016   SpO2 99%   BMI 30.40 kg/m       Objective:   Physical Exam  General Mental Status- Alert. General Appearance- Not in acute distress.   Head- posterior occipital  area tender to light touch   Skin General: Color- Normal Color. Moisture- Normal Moisture.  Neck Carotid Arteries- Normal color. Moisture- Normal Moisture. No carotid bruits. No JVD. Pt has mild mid cervical tenderness to palpation.  Chest and Lung Exam Auscultation: Breath Sounds:-Normal.  Cardiovascular Auscultation:Rythm- Regular. Murmurs & Other Heart Sounds:Auscultation of the heart reveals- No Murmurs.  Abdomen Inspection:-Inspeection Normal. Palpation/Percussion:Note:No mass. Palpation and Percussion of the abdomen reveal- Non Tender, Non Distended + BS, no rebound or guarding.  Neurologic Cranial Nerve exam:- CN III-XII intact(No nystagmus), symmetric smile. Strength:- 5/5 equal and symmetric strength both upper and lower extremities.  Rt elbow- pain and swelling to palpation. Can't extend arm fully. Also some distal humerus pain.       Assessment & Plan:  For your neck pain, elbow pain and humerus pain we gave you toradol 60 mg im.  No nsaid for 24 hours.   Rx diclofenac to start tomorrow. Decide to make tramadol available for more severe pain.  CT head and neck stat.  Arm sling pending xray. If elbow fracture will try to refer you to sports med or same day ortho.  If ct head negative but if you have worse signs or symptoms then ED evaluation main ED for MRI.  Concussion instruction rest brain.  If ct head negative but post concussive type symptoms then may need neurologist referral.  Follow up in  one week or as needed  Notified pt and husband of results. Both were on speaker phone. Advised if clinically elbow range of motion not normal or if in pain in on week then repeat xray and refer to sports medicine.  Pt does have some sinus disease on ct. Some occasional sinus pressure. Did rx augmentin.  Yossi Hinchman, Ramon DredgeEdward, PA-C

## 2016-01-17 NOTE — Progress Notes (Signed)
Pre visit review using our clinic review tool, if applicable. No additional management support is needed unless otherwise documented below in the visit note. 

## 2016-01-17 NOTE — Patient Instructions (Addendum)
For your neck pain, elbow pain and humerus pain we gave you toradol 60 mg im.  No nsaid for 24 hours.   Rx diclofenac to start tomorrow.  CT head and neck stat.  Arm sling pending xray. If elbow fracture will try to refer you to sports med or same day ortho.  If ct head negative but if you have worse signs or symptoms then ED evaluation main ED for MRI.  Concussion instruction rest brain.  If ct head negative but post concussive type symptoms then may need neurologist referral.  Follow up in one week or as needed   Concussion, Adult A concussion, or closed-head injury, is a brain injury caused by a direct blow to the head or by a quick and sudden movement (jolt) of the head or neck. Concussions are usually not life-threatening. Even so, the effects of a concussion can be serious. If you have had a concussion before, you are more likely to experience concussion-like symptoms after a direct blow to the head. What are the causes? This condition is caused by:  Direct blow to the head, such as from running into another player during a soccer game, being hit in a fight, or hitting your head on a hard surface.  A jolt of the head or neck that causes the brain to move back and forth inside the skull, such as in a car crash. What are the signs or symptoms? The signs of a concussion can be hard to notice. Early on, they may be missed by you, family members, and health care providers. You may look fine but act or feel differently. Symptoms are usually temporary, but they may last for days, weeks, or even longer. Some symptoms may appear right away while others may not show up for hours or days. Every head injury is different. Symptoms may include:  Mild to moderate headaches that will not go away.  A feeling of pressure inside your head.  Having more trouble than usual:  Learning or remembering things you have heard.  Answering questions.  Paying attention or  concentrating.  Organizing daily tasks.  Making decisions and solving problems.  Slowness in thinking, acting or reacting, speaking, or reading.  Getting lost or being easily confused.  Feeling tired all the time or lacking energy (fatigued).  Feeling drowsy.  Sleep disturbances.  Sleeping more than usual.  Sleeping less than usual.  Trouble falling asleep.  Trouble sleeping (insomnia).  Loss of balance or feeling lightheaded or dizzy.  Nausea or vomiting.  Numbness or tingling.  Increased sensitivity to:  Sounds.  Lights.  Distractions.  Vision problems or eyes that tire easily.  Diminished sense of taste or smell.  Ringing in the ears.  Mood changes such as feeling sad or anxious.  Becoming easily irritated or angry for little or no reason.  Lack of motivation.  Seeing or hearing things other people do not see or hear (hallucinations). How is this diagnosed? This condition is diagnosed based on:  Medical history. Your health care provider will ask for a description of your activity and your injury.  Symptoms. Your health care provider will ask whether you passed out (lost consciousness) and whether you are having trouble remembering events that happened right before and during your injury. You may also have other tests, including:  A brain scan to look for signs of injury to the brain. You may still have a concussion even if the scan shows no injury.  Blood tests to be sure  other problems are not present. How is this treated? This condition is usually treated in an emergency department, in an urgent care, or at a clinic.  Tell your health care provider if:  You are taking any medicines, including prescription medicines, over-the-counter medicines, and natural remedies. Some medicines, such as blood thinners (anticoagulants) and aspirin, may increase the chance of complications, such as bleeding.  You are taking or have taken alcohol or illegal  drugs. Alcohol and certain other drugs may slow your recovery and can put you at risk of further injury.  Your health care provider will send you home with important instructions to follow.  How fast you will recover from a concussion depends on many factors, such as how severe your concussion is, what part of your brain was injured, how old you are, and how healthy you were before the concussion. Recovery can take time.  Most people with mild injuries recover fully.  Recovery is slower in older people.  People who have had a concussion in the past or have other medical problems may take longer to recover. Follow these instructions at home: Activity  Limit activities that require a lot of thought or concentration. These include:  Doing homework or job-related work.  Watching TV.  Working on the computer.  Rest. Rest helps the brain to heal. Make sure you:  Get plenty of sleep at night. Avoid staying up late at night.  Keep the same bedtime hours on weekends and weekdays.  Rest during the day. Take daytime naps or rest breaks when you feel tired.  Avoid situations that increase your risk for another head injury (football, hockey, soccer, basketball, martial arts, downhill snow sports, and horseback riding). Your condition will get worse every time you experience a concussion.  Ask your health care provider when you can return to your normal activities, such as school, work, athletics, driving, riding a bicycle, or operating heavy machinery. Your ability to react may be slower after a brain injury. Never do these activities if you are dizzy.  Return to your normal activities slowly, not all at once. You must give your body and brain enough time for recovery. Preventing another concussion  It is very important to avoid another brain injury, especially as you recover. In rare cases, another injury can lead to permanent brain damage, brain swelling, or death. The risk of this is  greatest during the first 7-10 days after a head injury. Avoid injuries by:  Wearing a seat belt when riding in a car.  Limiting alcohol intake to no more than 1 drink a day for non-pregnant women and 2 drinks a day for men. One drink equals 12 oz. of beer, 5 oz. of wine, or 1 oz. of hard liquor.  Wearing a helmet when biking, skiing, skateboarding, skating, or doing similar activities.  Avoiding activities that could lead to a second concussion, such as contact or recreational sports, until your health care provider says it is okay.  Taking safety measures in your home.  Remove clutter and tripping hazards from floors and stairways.  Use grab bars in bathrooms and handrails by stairs.  Place non-slip mats on floors and in bathtubs.  Improve lighting in dim areas. General instructions  Take over-the-counter and prescription medicines only as told by your health care provider.  Do not drink alcohol until your health care provider says you are well enough to do so.  If it is harder than usual to remember things, write them down.  If  you are easily distracted, try to do one thing at a time. For example, do not try to watch TV while fixing dinner.  Talk with family members or close friends when making important decisions.  Watch your symptoms and tell others to do the same. Complications sometimes occur after a concussion. Older adults with a brain injury may have a higher risk of serious complications, such as a blood clot on the brain.  Tell your teachers, school nurse, school counselor, coach, athletic trainer, or work Production designer, theatre/television/film about your injury, symptoms, and restrictions. Tell them about what you can or cannot do. They should watch for:  Increased problems with attention or concentration.  Increased difficulty remembering or learning new information.  Increased time needed to complete tasks or assignments.  Increased irritability or decreased ability to cope with  stress.  Increased symptoms.  Keep all follow-up visits as told by your health care provider. This is important because repeated evaluation of your symptoms is recommended for your recovery. Contact a health care provider if:  You have increased problems paying attention or concentrating.  You have increased difficulty remembering or learning new information.  You need more time to complete tasks or assignments than before.  You have increased irritability or decreased ability to cope with stress.  You have more symptoms than before. Seek medical care if you have any of the following symptoms for more than 2 weeks after your injury:  Long-lasting (chronic) headaches.  Dizziness or balance problems.  Nausea.  Vision problems.  Increased sensitivity to noise or light.  Depression or mood swings.  Anxiety or irritability.  Memory problems.  Difficulty concentrating or paying attention.  Sleep problems.  Feeling tired all the time. Get help right away if:  You have severe or worsening headaches. These may be a sign of a blood clot in the brain.  You have weakness (even if only in one hand, leg, or part of the face).  You have numbness.  You have decreased coordination.  You vomit repeatedly.  You have increased sleepiness.  One pupil is larger than the other.  You have convulsions.  You have slurred speech.  You have increased confusion. This may be a sign of a blood clot in the brain.  You have increased restlessness, agitation, or irritability.  You are unable to recognize people or places.  You have neck pain.  It is difficult to wake you up.  You have unusual behavior changes.  You lose consciousness. This information is not intended to replace advice given to you by your health care provider. Make sure you discuss any questions you have with your health care provider. Document Released: 03/31/2003 Document Revised: 06/16/2015 Document  Reviewed: 07/31/2012 Elsevier Interactive Patient Education  2017 [employer].

## 2016-01-24 ENCOUNTER — Telehealth: Payer: Self-pay | Admitting: Family

## 2016-01-24 DIAGNOSIS — M25521 Pain in right elbow: Secondary | ICD-10-CM

## 2016-01-24 NOTE — Telephone Encounter (Signed)
Caller name: Relation to pt: Call back number: [phone] Pharmacy:  Reason for call: pt has an appt with Dr. Pearletha ForgeHudnall tomorrow at 4 for her elbow. Pt is needing a referral, pt has bcbs

## 2016-01-25 ENCOUNTER — Encounter: Payer: Self-pay | Admitting: Family Medicine

## 2016-01-25 ENCOUNTER — Ambulatory Visit (INDEPENDENT_AMBULATORY_CARE_PROVIDER_SITE_OTHER): Payer: BLUE CROSS/BLUE SHIELD | Admitting: Family Medicine

## 2016-01-25 ENCOUNTER — Ambulatory Visit (HOSPITAL_BASED_OUTPATIENT_CLINIC_OR_DEPARTMENT_OTHER)
Admission: RE | Admit: 2016-01-25 | Discharge: 2016-01-25 | Disposition: A | Discharge status: Home / Self Care | Payer: BLUE CROSS/BLUE SHIELD | Source: Ambulatory Visit | Attending: Family Medicine | Admitting: Family Medicine

## 2016-01-25 VITALS — BP 113/75 | HR 85 | Ht 63.0 in | Wt 175.0 lb

## 2016-01-25 DIAGNOSIS — S59901A Unspecified injury of right elbow, initial encounter: Secondary | ICD-10-CM

## 2016-01-25 DIAGNOSIS — S59901D Unspecified injury of right elbow, subsequent encounter: Secondary | ICD-10-CM | POA: Insufficient documentation

## 2016-01-25 DIAGNOSIS — X58XXXD Exposure to other specified factors, subsequent encounter: Secondary | ICD-10-CM | POA: Insufficient documentation

## 2016-01-25 NOTE — Patient Instructions (Signed)
You have a contusion and triceps strain. Ibuprofen 600mg  three times a day OR aleve 2 tabs twice a day with food only if needed for pain and inflammation. Icing or heat as needed 15 minutes at a time. Focus on regaining your extension as I showed you. Once you have full extension you can do basic light weight biceps curls and triceps extensions. Follow up with me in 3-5 weeks or as needed if you're doing well. Call me if you have any problems.

## 2016-01-26 DIAGNOSIS — S59901A Unspecified injury of right elbow, initial encounter: Secondary | ICD-10-CM | POA: Insufficient documentation

## 2016-01-26 NOTE — Progress Notes (Signed)
PCP and consultation requested by: Lemont Fillers., NP  Subjective:   HPI: Patient is a 31 y.o. female here for right elbow injury.  Patient reports on 12/25 she fell off a hoverboard directly onto her right elbow (struck head on floor as well). Went to ED - had cervical spine, head CTs as well as radiographs of humerus and elbow. No evidence of fracture on these. Placed in a sling. Pain still 5/10 on elbow, posterior primarily. Still with some bruising. Taking ibuprofen, tylenol with tramadol at bedtime. Resting this under some pillows. Worse with any motion of elbow especially extension. No skin changes otherwise.  No numbness.  Past Medical History:  Diagnosis Date  . Asthma    bring inhalers to hospital  . Bronchitis   . Complication of anesthesia    temp. dropped and O2 dropped  . Hepatitis C carrier (HCC)   . Seasonal allergies     Current Outpatient Prescriptions on File Prior to Visit  Medication Sig Dispense Refill  . acetaminophen (TYLENOL) 500 MG tablet Take 500 mg by mouth every 6 (six) hours as needed for moderate pain or headache.     . albuterol (PROVENTIL HFA;VENTOLIN HFA) 108 (90 Base) MCG/ACT inhaler Inhale 1-2 puffs into the lungs every 6 (six) hours as needed for wheezing or shortness of breath.    Marland Kitchen amoxicillin-clavulanate (AUGMENTIN) 875-125 MG tablet Take 1 tablet by mouth 2 (two) times daily. 20 tablet 0  . diclofenac (VOLTAREN) 75 MG EC tablet Take 1 tablet (75 mg total) by mouth 2 (two) times daily. Can start tomorrow Jan 19, 2016 noon. 30 tablet 0  . ferrous sulfate 325 (65 FE) MG tablet Take 1 tablet (325 mg total) by mouth 2 (two) times daily with a meal. (Patient not taking: Reported on 01/17/2016) 60 tablet 3  . Fluticasone-Salmeterol (ADVAIR) 250-50 MCG/DOSE AEPB Inhale 1 puff into the lungs 2 (two) times daily.    . Prenatal Vit-Fe Fumarate-FA (PRENATAL MULTIVITAMIN) TABS tablet Take 1 tablet by mouth daily at 12 noon.    . simethicone  (MYLICON) 80 MG chewable tablet Chew 80 mg by mouth at bedtime as needed for flatulence.    . traMADol (ULTRAM) 50 MG tablet Take 1 tablet (50 mg total) by mouth every 8 (eight) hours as needed. 12 tablet 0   No current facility-administered medications on file prior to visit.     Past Surgical History:  Procedure Laterality Date  . CESAREAN SECTION    . CESAREAN SECTION N/A 06/10/2015   Procedure: REPEAT CESAREAN SECTION;  Surgeon: Carrington Clamp, MD;  Location: [facility];  Service: Obstetrics;  Laterality: N/A;  . WISDOM TOOTH EXTRACTION      Allergies  Allergen Reactions  . Phenergan [Promethazine Hcl] Other (See Comments)    Altered behaviors/mental state     Social History   Social History  . Marital status: Married    Spouse name: N/A  . Number of children: N/A  . Years of education: N/A   Occupational History  . Not on file.   Social History Main Topics  . Smoking status: Never Smoker  . Smokeless tobacco: Never Used  . Alcohol use No  . Drug use: No  . Sexual activity: Yes   Other Topics Concern  . 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    Family History  Problem Relation  Age of Onset  . Arthritis Father   . Cancer Father     bladder cancer  . COPD Father   . Heart disease Father   . Hypertension Father   . Hyperlipidemia Father   . Kidney disease Father   . Hypertension Maternal Grandmother   . Hyperlipidemia Maternal Grandmother   . Stroke Maternal Grandmother   . Deep vein thrombosis Maternal Grandmother   . Heart attack Maternal Grandmother     BP 113/75   Pulse 85   Ht 5\' 3"  (1.6 m)   Wt 175 lb (79.4 kg)   LMP 01/10/2016   BMI 31.00 kg/m   Review of Systems: See HPI above.     Objective:  Physical Exam:  Gen: NAD, comfortable in exam room  Right elbow: Posterior bruising.  Mild swelling diffusely.  No other deformity. TTP  circumferentially but worse triceps tendon area, olecranon.   Lacks about 15 degrees of extension, painful.  Full flexion. Pain but no laxity on testing collateral ligaments. NVI distally.  Left elbow: FROM without pain.   Assessment & Plan:  1. Right elbow injury - independently reviewed repeat radiographs and no evidence fracture or effusion.  Exam, history consistent with elbow contusion and triceps strain.  Ibuprofen or aleve if needed.  Ice/heat.  Focus on regaining extension - shown exercises.  Once extension returns start strengthening exercises.  F/u in 3-5 weeks for reevaluation.

## 2016-01-26 NOTE — Assessment & Plan Note (Signed)
independently reviewed repeat radiographs and no evidence fracture or effusion.  Exam, history consistent with elbow contusion and triceps strain.  Ibuprofen or aleve if needed.  Ice/heat.  Focus on regaining extension - shown exercises.  Once extension returns start strengthening exercises.  F/u in 3-5 weeks for reevaluation.
